# Patient Record
Sex: Female | Born: 1975 | Race: White | Hispanic: No | Marital: Married | Smoking: Never smoker
Health system: Southern US, Community
[De-identification: ages and names within clinical notes are randomized; demographics above are authoritative.]

## PROBLEM LIST (undated history)

## (undated) DIAGNOSIS — R011 Cardiac murmur, unspecified: Secondary | ICD-10-CM

## (undated) DIAGNOSIS — F988 Other specified behavioral and emotional disorders with onset usually occurring in childhood and adolescence: Secondary | ICD-10-CM

## (undated) HISTORY — PX: TYMPANOSTOMY TUBE PLACEMENT: SHX32

## (undated) HISTORY — PX: OTHER SURGICAL HISTORY: SHX169

## (undated) HISTORY — DX: Cardiac murmur, unspecified: R01.1

## (undated) HISTORY — DX: Other specified behavioral and emotional disorders with onset usually occurring in childhood and adolescence: F98.8

---

## 1998-01-13 ENCOUNTER — Other Ambulatory Visit: Admission: RE | Admit: 1998-01-13 | Discharge: 1998-01-13 | Payer: Self-pay | Admitting: Obstetrics and Gynecology

## 1999-02-02 ENCOUNTER — Other Ambulatory Visit: Admission: RE | Admit: 1999-02-02 | Discharge: 1999-02-02 | Payer: Self-pay | Admitting: Obstetrics and Gynecology

## 2000-02-03 ENCOUNTER — Other Ambulatory Visit: Admission: RE | Admit: 2000-02-03 | Discharge: 2000-02-03 | Payer: Self-pay | Admitting: Obstetrics and Gynecology

## 2000-09-09 ENCOUNTER — Inpatient Hospital Stay (HOSPITAL_COMMUNITY): Admission: AD | Admit: 2000-09-09 | Discharge: 2000-09-12 | Payer: Self-pay | Admitting: Obstetrics and Gynecology

## 2000-09-16 ENCOUNTER — Encounter: Admission: RE | Admit: 2000-09-16 | Discharge: 2000-10-05 | Payer: Self-pay | Admitting: Obstetrics and Gynecology

## 2001-02-06 ENCOUNTER — Other Ambulatory Visit: Admission: RE | Admit: 2001-02-06 | Discharge: 2001-02-06 | Payer: Self-pay | Admitting: Obstetrics and Gynecology

## 2002-02-12 ENCOUNTER — Other Ambulatory Visit: Admission: RE | Admit: 2002-02-12 | Discharge: 2002-02-12 | Payer: Self-pay | Admitting: Obstetrics and Gynecology

## 2003-02-12 ENCOUNTER — Other Ambulatory Visit: Admission: RE | Admit: 2003-02-12 | Discharge: 2003-02-12 | Payer: Self-pay | Admitting: Obstetrics and Gynecology

## 2003-02-13 ENCOUNTER — Other Ambulatory Visit: Admission: RE | Admit: 2003-02-13 | Discharge: 2003-02-13 | Payer: Self-pay | Admitting: Obstetrics and Gynecology

## 2003-08-28 ENCOUNTER — Inpatient Hospital Stay (HOSPITAL_COMMUNITY): Admission: AD | Admit: 2003-08-28 | Discharge: 2003-08-28 | Payer: Self-pay | Admitting: Obstetrics and Gynecology

## 2003-09-19 ENCOUNTER — Inpatient Hospital Stay (HOSPITAL_COMMUNITY): Admission: RE | Admit: 2003-09-19 | Discharge: 2003-09-21 | Payer: Self-pay | Admitting: Obstetrics and Gynecology

## 2003-09-19 ENCOUNTER — Encounter (INDEPENDENT_AMBULATORY_CARE_PROVIDER_SITE_OTHER): Payer: Self-pay | Admitting: *Deleted

## 2004-03-05 ENCOUNTER — Other Ambulatory Visit: Admission: RE | Admit: 2004-03-05 | Discharge: 2004-03-05 | Payer: Self-pay | Admitting: Obstetrics and Gynecology

## 2005-03-23 ENCOUNTER — Other Ambulatory Visit: Admission: RE | Admit: 2005-03-23 | Discharge: 2005-03-23 | Payer: Self-pay | Admitting: Obstetrics and Gynecology

## 2008-05-07 ENCOUNTER — Encounter: Admission: RE | Admit: 2008-05-07 | Discharge: 2008-05-07 | Payer: Self-pay | Admitting: Obstetrics and Gynecology

## 2010-07-24 NOTE — Op Note (Signed)
NAME:  Cassandra Estrada, Cassandra Estrada                      ACCOUNT NO.:  [id number]   MEDICAL RECORD NO.:  [id number]                   PATIENT TYPE:  INP   LOCATION:  9130                                 FACILITY:  [facility]   PHYSICIAN:  Malva Limes, M.D.                 DATE OF BIRTH:  02-25-1976   DATE OF PROCEDURE:  09/19/2003  DATE OF DISCHARGE:                                 OPERATIVE REPORT   PREOPERATIVE DIAGNOSES:  1. Intrauterine pregnancy at 4 weeks estimated gestational age.  2. History of prior cesarean section.  3. Desires repeat cesarean section and bilateral tubal ligation.   POSTOPERATIVE DIAGNOSIS:  1. Intrauterine pregnancy at 30 weeks estimated gestational age.  2. History of prior cesarean section.  3. Desires repeat cesarean section and bilateral tubal ligation.   PROCEDURE:  Repeat cesarean section with bilateral tubal ligation, Pomeroy  procedure.   SURGEON:  Malva Limes, M.D.   ASSISTANT:  Luvenia Redden, M.D.   ANESTHESIA:  Spinal.   ANTIBIOTICS:  Ancef 1 g.   ESTIMATED BLOOD LOSS:  900 mL.   COMPLICATIONS:  None.   SPECIMENS:  Portion of left and right fallopian tubes sent to pathology.   DRAINS:  Foley to bedside drainage.   FINDINGS:  The patient delivered one live viable white female infant  weighing 6 pounds 11 ounces.  Fallopian tubes and ovaries were within normal  limits.  Uterus was within normal limits.   DESCRIPTION OF PROCEDURE:  The patient was taken to the operating room where  a spinal anesthetic was administered without complications.  She was then  placed in the dorsal supine position with a left lateral tilt.  The patient  was prepped with Hibiclens.  A Foley catheter was placed and she was draped  in the usual fashion for this procedure.  A Pfannenstiel incision was made  through the previous scar.  This was carried down to the facia.  The fascia  was entered in the midline and extended laterally with Mayo scissors.  Rectus muscles  were then dissected from the fascia with the Bovie.  Rectus  muscles were divided midline and taken superiorly and inferiorly.  The  parietal peritoneum was entered sharply and taken superiorly and inferiorly.  The bladder flap was then taken down sharply.  A low transverse uterine  incision was made in the midline and extended laterally with blunt  dissection.  The amniotic fluid was noted to be clear.  The infant was  delivered in the vertex presentation.  On delivery of the head, the  oropharynx and nostrils were bulb suctioned.  The remaining infant was then  delivered.  The cord was then doubly clamped, cut and the infant handed to  the awaiting NICU team.  Cord blood was then obtained.  The placenta was  then manually removed.  Uterus was exteriorized.  The uterine cavity was  cleaned with a wet lap.  The  uterine incision was closed in a single layer  of 0 chromic in a running locking fashion.  The bladder flap was closed  using 3-0 chromic in a running fashion.  At this point, the isthmic portion  of the right fallopian tube was grasped with a Babcock.  A 2 to 3 cm knuckle  of fallopian tube was doubly ligated with 0 gut.  The knuckle was then  excised.  Both ostia were visualized.  Hemostasis appeared to be adequate.  A similar procedure was performed on the opposite site.  Following this, the  uterus was placed back in the abdominal cavity.  Hemostasis was checked.  Tubal ligation sutures were also checked.  At this point, parietal  peritoneum and rectus muscles were reapproximated in the midline using 3-0  chromic in a running fashion.  The fascia was then closed using 0 Monocryl  suture in a running fashion.  Subcuticular tissue was made hemostatic with  the Bovie. Stainless steel clips were used to close the skin.  The patient  tolerated the procedure well.  She was taken to the recovery room in stable  condition.  Instrument and lap counts were correct x2.                                                Malva Limes, M.D.    MA/MEDQ  D:  09/19/2003  T:  09/19/2003  Job:  [id number]

## 2010-07-24 NOTE — Discharge Summary (Signed)
Cassandra Estrada, [name]                      ACCOUNT NO.:  [id number]   MEDICAL RECORD NO.:  [id number]                   PATIENT TYPE:  INP   LOCATION:  9130                                 FACILITY:  [facility]   PHYSICIAN:  Randye Lobo, M.D.                DATE OF BIRTH:  07/13/1975   DATE OF ADMISSION:  09/19/2003  DATE OF DISCHARGE:  09/21/2003                                 DISCHARGE SUMMARY   FINAL DIAGNOSES:  1. Intrauterine pregnancy at 81 weeks estimated gestational age.  2. History of prior cesarean section, desires repeat cesarean section.  3. Desires permanent sterilization.   SURGEON:  Dr. Malva Limes.   ASSISTANT:  Dr. Lodema Hong.   COMPLICATIONS:  None.   This 35 year old G2 P0-1-0-1 presents at [redacted] weeks gestation for repeat  cesarean section and tubal ligation.  The patient's antepartum course had  been uncomplicated.  She does have a history of HSV but not had any  complications during her pregnancy.  At this point she is admitted.  She  desires a repeat cesarean section and desires permanent sterilization.  She  was taken to the operating room by Dr. Malva Limes on September 19, 2003 where  a repeat low transverse cesarean section is performed with the delivery of a  6-pound 11-ounce female infant with Apgars of 5 and 9.  Delivery went  without complication.  The patient still expressed her desires for a tubal  ligation and this was performed using the Pomeroy method.  The delivery went  without complications.  The patient's postoperative course was benign  without significant fevers.  The patient was felt ready for discharge on  postoperative day #2,  was sent home on a regular diet, told to decrease  activities, told to continue prenatal vitamins, was given a prescription for  Percocet one to two q.4h. as needed for pain and told she could use over-the-  counter ibuprofen as needed.  She was to follow up in the office in 2 days  for her staple removal, and of  course was to call with any problems.   LABORATORY DATA ON DISCHARGE:  The patient had a hemoglobin of 12.0, white  blood cell count of 18.7.     Leilani Able, P.A.-C.                Randye Lobo, M.D.    MB/MEDQ  D:  10/17/2003  T:  10/18/2003  Job:  [id number]

## 2010-07-24 NOTE — Discharge Summary (Signed)
[facility]  Patient:    Cassandra Estrada, Cassandra Estrada                   MRN: [id number] Adm. Date:  [id number] Disc. Date: [id number] Attending:  Miguel Aschoff Dictator:   Leilani Able, P.A.                           Discharge Summary  FINAL DIAGNOSES:              Intrauterine pregnancy at [redacted] weeks gestation, nonreassuring fetal heart pattern.  PROCEDURE:                    Primary low transverse cesarean section.  SURGEON:                      Miguel Aschoff, M.D.  COMPLICATIONS:                None.  HISTORY:                      This 35 year old G1, P0 presents at [redacted] weeks gestation with spontaneous rupture of membranes with grossly clear fluid. Patient was admitted to the hospital.  She progressed slowly and did reach about 8 cm of dilation.  At this point she developed some deep variable decelerations with very slow recovery that did not respond to oxygen and position change.  There were some deep decelerations occurring even when patient was on her left side again.  In view of these decelerations patient was taken to the operating room for a cesarean section.  Patient was taken to the operating room on September 09, 2000 by Dr. Miguel Aschoff where a primary low transverse cesarean section was performed with the delivery of a 6 pound 10 ounce female infant with Apgars of 8 and 8.  Delivery went without complications.  Patients postoperative course was benign without significant fevers.  She was felt ready for discharge on postoperative day #2.  Sent home on a regular diet.  Told to decrease activities.  Told to continue prenatal vitamins.  Was given a prescription for Tylox and Motrin.  Told to follow-up in the office in four weeks.DD:  09/29/00 TD:  09/29/00 Job: [id number]

## 2010-07-24 NOTE — Op Note (Signed)
[facility]  Patient:    Cassandra Estrada                   MRN: [id number] Proc. Date: 09/09/00 Adm. Date:  [id number] Attending:  Miguel Aschoff                           Operative Report  PREOPERATIVE DIAGNOSES:       1. Intrauterine pregnancy at 37 weeks.                               2. Nonreassuring fetal heart pattern.  POSTOPERATIVE DIAGNOSES:      1. Intrauterine pregnancy at 37 weeks.                               2. Nonreassuring fetal heart pattern.                               3. Delivery of viable female infant, Apgars                                  8, 8.  OPERATION:                    Primary low transverse cesarean section.  SURGEON:                      Miguel Aschoff, M.D.  ANESTHESIA:                   Epidural.  COMPLICATIONS:                None.  ESTIMATED BLOOD LOSS:         Approximately 600 cc.  JUSTIFICATION:                The patient is a 35 year old white female, gravida 1, para 0, with an estimated date of confinement by ultrasound of September 27, 2000.  She presented to the office with spontaneous rupture of membranes with grossly clear fluid.  She was admitted to the [facility] and did progress slowly and did reach approximately 8 cm.  Then she developed deep variable decelerations with very slow recovery that did respond to oxygen and position treatment.  Then when placed on her left side again, deep decelerations occurred.  In view of these decelerations and the patient being a primigravida with delivery not imminent, it was felt that labor should not be allowed to continue, and she is being taken up for primary cesarean section.  DESCRIPTION OF PROCEDURE:     The patient was taken to the operating room, placed in a supine position.  While being monitored, the heart rate did respond, and it was possible to allow a satisfactory level of anesthesia to be achieved.  Once this was done, Pfannenstiel incision was  made, extended down through the subcutaneous tissue, with bleeding points being clamped and coagulated as they were encountered.  The fascia was then identified, incised transversely, and separated from the underlying rectus muscles.  Rectus muscles were divided in the midline.  Peritoneum was then identified and entered carefully, avoiding underlying structures.  The bladder flap was then  created and protected with the bladder blade.  Then elliptical transverse incision was made into the lower uterine segment.  Amniotic cavity was entered.  Clear fluid was obtained.  The patient was then delivered of a viable female infant, Apgar 8 at one minute and 8 at five minutes, from a vertex ROP position.  The nose and mouth were suctioned.  The baby was handed to the pediatric team in attendance.  No obvious nuchal cord was noted or cord about an extremity which would have explained the decelerations.  Cord blood were then obtained for appropriate studies, and then the placenta was delivered.  The uterus was then evacuated of any remaining products of conception.  The angles of the uterine incision were then ligated using figure-of-eight sutures of #1 Vicryl.  Then the uterus was closed in layers. The first layer was a running interlocking suture of #1 Vicryl, followed by an imbricating suture of #1 Vicryl.  The bladder flap was then reapproximated using running continuous 2-0 Vicryl suture.  Lap counts were taken and found to be correct.  There was excellent hemostasis, and at this point the abdomen was closed with the parietal peritoneum closed using running continuous 0 Vicryl suture.  The fascia was closed using two sutures of 0 Vicryl starting at the lateral fascial angles and meeting in the midline.  Subcutaneous tissue and skin were closed using staples.  The patient tolerated the procedure well and went to the recovery room in satisfactory condition. DD:  09/09/00 TD:  09/10/00 Job:  [id number]

## 2010-08-13 ENCOUNTER — Other Ambulatory Visit: Payer: Self-pay | Admitting: Obstetrics and Gynecology

## 2011-08-24 ENCOUNTER — Other Ambulatory Visit: Payer: Self-pay | Admitting: Obstetrics and Gynecology

## 2012-06-19 ENCOUNTER — Encounter: Payer: Self-pay | Admitting: Nurse Practitioner

## 2012-06-19 ENCOUNTER — Encounter: Payer: Self-pay | Admitting: *Deleted

## 2012-06-19 ENCOUNTER — Ambulatory Visit (INDEPENDENT_AMBULATORY_CARE_PROVIDER_SITE_OTHER): Payer: BC Managed Care – PPO | Admitting: Nurse Practitioner

## 2012-06-19 VITALS — BP 107/73 | HR 67 | Temp 97.8°F | Ht 64.0 in | Wt 134.0 lb

## 2012-06-19 DIAGNOSIS — F988 Other specified behavioral and emotional disorders with onset usually occurring in childhood and adolescence: Secondary | ICD-10-CM

## 2012-06-19 MED ORDER — LISDEXAMFETAMINE DIMESYLATE 40 MG PO CAPS: 40.0000 mg | ORAL_CAPSULE | ORAL | Status: DC

## 2012-06-19 NOTE — Progress Notes (Signed)
  Subjective:    Patient ID: Cassandra Estrada, female    DOB: 09/11/1969, 37 y.o.   MRN: [id number]  HPI Pt states things are good but would like to increase dosage for the remainder of the semester. 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 Feels it would help with balancing her tasks and accomplishing all of her necessary tasks. States she is aware that she may feel the side effects of the medications a bit more with an increased dose. Pt's only complaint is occasional burning stomach pain that occurs more in the afternoons/evening. Pt feels she can control it with diet.    Review of Systems  Gastrointestinal: Positive for abdominal pain (described as "burning").  All other systems reviewed and are negative.       Objective:   Physical Exam  Vitals reviewed. Cardiovascular: Normal rate, regular rhythm and normal heart sounds.   Pulmonary/Chest: Effort normal and breath sounds normal.  Psychiatric: She has a normal mood and affect. Her behavior is normal. Judgment and thought content normal.   BP 107/73  Pulse 67  Temp(Src) 97.8 F (36.6 C) (Oral)  Ht 5\' 4"  (1.626 m)  Wt 134 lb (60.782 kg)  BMI 22.99 kg/m2  LMP 06/08/2012        Assessment & Plan:  1. ADD (attention deficit disorder) Stress management - lisdexamfetamine (VYVANSE) 40 MG capsule; Take 1 capsule (40 mg total) by mouth every morning.  Dispense: 30 capsule; Refill: 0- Post dated RX as well to be fill 07/19/12. Cassandra Daphine Deutscher, FNP

## 2012-06-19 NOTE — Patient Instructions (Signed)
Attention Deficit Hyperactivity Disorder Attention deficit hyperactivity disorder (ADHD) is a problem with behavior issues based on the way the brain functions (neurobehavioral disorder). It is a common reason for behavior and academic problems in school. CAUSES  The cause of ADHD is unknown in most cases. It may run in families. It sometimes can be associated with learning disabilities and other behavioral problems. SYMPTOMS  There are 3 types of ADHD. The 3 types and some of the symptoms include:  Inattentive  Gets bored or distracted easily.  Loses or forgets things. Forgets to hand in homework.  Has trouble organizing or completing tasks.  Difficulty staying on task.  An inability to organize daily tasks and school work.  Leaving projects, chores, or homework unfinished.  Trouble paying attention or responding to details. Careless mistakes.  Difficulty following directions. Often seems like is not listening.  Dislikes activities that require sustained attention (like chores or homework).  Hyperactive-impulsive  Feels like it is impossible to sit still or stay in a seat. Fidgeting with hands and feet.  Trouble waiting turn.  Talking too much or out of turn. Interruptive.  Speaks or acts impulsively.  Aggressive, disruptive behavior.  Constantly busy or on the go, noisy.  Combined  Has symptoms of both of the above. Often children with ADHD feel discouraged about themselves and with school. They often perform well below their abilities in school. These symptoms can cause problems in home, school, and in relationships with peers. As children get older, the excess motor activities can calm down, but the problems with paying attention and staying organized persist. Most children do not outgrow ADHD but with good treatment can learn to cope with the symptoms. DIAGNOSIS  When ADHD is suspected, the diagnosis should be made by professionals trained in ADHD.  Diagnosis will  include:  Ruling out other reasons for the child's behavior.  The caregivers will check with the child's school and check their medical records.  They will talk to teachers and parents.  Behavior rating scales for the child will be filled out by those dealing with the child on a daily basis. A diagnosis is made only after all information has been considered. TREATMENT  Treatment usually includes behavioral treatment often along with medicines. It may include stimulant medicines. The stimulant medicines decrease impulsivity and hyperactivity and increase attention. Other medicines used include antidepressants and certain blood pressure medicines. Most experts agree that treatment for ADHD should address all aspects of the child's functioning. Treatment should not be limited to the use of medicines alone. Treatment should include structured classroom management. The parents must receive education to address rewarding good behavior, discipline, and limit-setting. Tutoring or behavioral therapy or both should be available for the child. If untreated, the disorder can have long-term serious effects into adolescence and adulthood. HOME CARE INSTRUCTIONS   Often with ADHD there is a lot of frustration among the family in dealing with the illness. There is often blame and anger that is not warranted. This is a life long illness. There is no way to prevent ADHD. In many cases, because the problem affects the family as a whole, the entire family may need help. A therapist can help the family find better ways to handle the disruptive behaviors and promote change. If the child is young, most of the therapist's work is with the parents. Parents will learn techniques for coping with and improving their child's behavior. Sometimes only the child with the ADHD needs counseling. Your caregivers can help   you make these decisions.  Children with ADHD may need help in organizing. Some helpful tips include:  Keep  routines the same every day from wake-up time to bedtime. Schedule everything. This includes homework and playtime. This should include outdoor and indoor recreation. Keep the schedule on the refrigerator or a bulletin board where it is frequently seen. Mark schedule changes as far in advance as possible.  Have a place for everything and keep everything in its place. This includes clothing, backpacks, and school supplies.  Encourage writing down assignments and bringing home needed books.  Offer your child a well-balanced diet. Breakfast is especially important for school performance. Children should avoid drinks with caffeine including:  Soft drinks.  Coffee.  Tea.  However, some older children (adolescents) may find these drinks helpful in improving their attention.  Children with ADHD need consistent rules that they can understand and follow. If rules are followed, give small rewards. Children with ADHD often receive, and expect, criticism. Look for good behavior and praise it. Set realistic goals. Give clear instructions. Look for activities that can foster success and self-esteem. Make time for pleasant activities with your child. Give lots of affection.  Parents are their children's greatest advocates. Learn as much as possible about ADHD. This helps you become a stronger and better advocate for your child. It also helps you educate your child's teachers and instructors if they feel inadequate in these areas. Parent support groups are often helpful. A national group with local chapters is called CHADD (Children and Adults with Attention Deficit Hyperactivity Disorder). PROGNOSIS  There is no cure for ADHD. Children with the disorder seldom outgrow it. Many find adaptive ways to accommodate the ADHD as they mature. SEEK MEDICAL CARE IF:  Your child has repeated muscle twitches, cough or speech outbursts.  Your child has sleep problems.  Your child has a marked loss of  appetite.  Your child develops depression.  Your child has new or worsening behavioral problems.  Your child develops dizziness.  Your child has a racing heart.  Your child has stomach pains.  Your child develops headaches. Document Released: 02/12/2002 Document Revised: 05/17/2011 Document Reviewed: 09/25/2007 [employer] Patient Information 2013 [employer].  

## 2012-06-21 ENCOUNTER — Encounter: Payer: Self-pay | Admitting: *Deleted

## 2012-07-28 ENCOUNTER — Telehealth: Payer: Self-pay | Admitting: Nurse Practitioner

## 2012-07-28 MED ORDER — SERTRALINE HCL 50 MG PO TABS: 50.0000 mg | ORAL_TABLET | Freq: Every day | ORAL | Status: DC

## 2012-07-28 NOTE — Telephone Encounter (Signed)
zoloft rx sent in

## 2012-08-29 ENCOUNTER — Other Ambulatory Visit: Payer: Self-pay | Admitting: Obstetrics and Gynecology

## 2012-09-15 ENCOUNTER — Encounter: Payer: Self-pay | Admitting: Nurse Practitioner

## 2012-09-15 ENCOUNTER — Ambulatory Visit (INDEPENDENT_AMBULATORY_CARE_PROVIDER_SITE_OTHER): Payer: BC Managed Care – PPO | Admitting: Nurse Practitioner

## 2012-09-15 VITALS — BP 113/78 | HR 66 | Temp 98.2°F | Ht 64.0 in | Wt 130.0 lb

## 2012-09-15 DIAGNOSIS — F988 Other specified behavioral and emotional disorders with onset usually occurring in childhood and adolescence: Secondary | ICD-10-CM

## 2012-09-15 MED ORDER — LISDEXAMFETAMINE DIMESYLATE 40 MG PO CAPS: 40.0000 mg | ORAL_CAPSULE | ORAL | Status: DC

## 2012-09-15 NOTE — Patient Instructions (Signed)
Attention Deficit Hyperactivity Disorder Attention deficit hyperactivity disorder (ADHD) is a problem with behavior issues based on the way the brain functions (neurobehavioral disorder). It is a common reason for behavior and academic problems in school. CAUSES  The cause of ADHD is unknown in most cases. It may run in families. It sometimes can be associated with learning disabilities and other behavioral problems. SYMPTOMS  There are 3 types of ADHD. The 3 types and some of the symptoms include:  Inattentive  Gets bored or distracted easily.  Loses or forgets things. Forgets to hand in homework.  Has trouble organizing or completing tasks.  Difficulty staying on task.  An inability to organize daily tasks and school work.  Leaving projects, chores, or homework unfinished.  Trouble paying attention or responding to details. Careless mistakes.  Difficulty following directions. Often seems like is not listening.  Dislikes activities that require sustained attention (like chores or homework).  Hyperactive-impulsive  Feels like it is impossible to sit still or stay in a seat. Fidgeting with hands and feet.  Trouble waiting turn.  Talking too much or out of turn. Interruptive.  Speaks or acts impulsively.  Aggressive, disruptive behavior.  Constantly busy or on the go, noisy.  Combined  Has symptoms of both of the above. Often children with ADHD feel discouraged about themselves and with school. They often perform well below their abilities in school. These symptoms can cause problems in home, school, and in relationships with peers. As children get older, the excess motor activities can calm down, but the problems with paying attention and staying organized persist. Most children do not outgrow ADHD but with good treatment can learn to cope with the symptoms. DIAGNOSIS  When ADHD is suspected, the diagnosis should be made by professionals trained in ADHD.  Diagnosis will  include:  Ruling out other reasons for the child's behavior.  The caregivers will check with the child's school and check their medical records.  They will talk to teachers and parents.  Behavior rating scales for the child will be filled out by those dealing with the child on a daily basis. A diagnosis is made only after all information has been considered. TREATMENT  Treatment usually includes behavioral treatment often along with medicines. It may include stimulant medicines. The stimulant medicines decrease impulsivity and hyperactivity and increase attention. Other medicines used include antidepressants and certain blood pressure medicines. Most experts agree that treatment for ADHD should address all aspects of the child's functioning. Treatment should not be limited to the use of medicines alone. Treatment should include structured classroom management. The parents must receive education to address rewarding good behavior, discipline, and limit-setting. Tutoring or behavioral therapy or both should be available for the child. If untreated, the disorder can have long-term serious effects into adolescence and adulthood. HOME CARE INSTRUCTIONS   Often with ADHD there is a lot of frustration among the family in dealing with the illness. There is often blame and anger that is not warranted. This is a life long illness. There is no way to prevent ADHD. In many cases, because the problem affects the family as a whole, the entire family may need help. A therapist can help the family find better ways to handle the disruptive behaviors and promote change. If the child is young, most of the therapist's work is with the parents. Parents will learn techniques for coping with and improving their child's behavior. Sometimes only the child with the ADHD needs counseling. Your caregivers can help   you make these decisions.  Children with ADHD may need help in organizing. Some helpful tips include:  Keep  routines the same every day from wake-up time to bedtime. Schedule everything. This includes homework and playtime. This should include outdoor and indoor recreation. Keep the schedule on the refrigerator or a bulletin board where it is frequently seen. Mark schedule changes as far in advance as possible.  Have a place for everything and keep everything in its place. This includes clothing, backpacks, and school supplies.  Encourage writing down assignments and bringing home needed books.  Offer your child a well-balanced diet. Breakfast is especially important for school performance. Children should avoid drinks with caffeine including:  Soft drinks.  Coffee.  Tea.  However, some older children (adolescents) may find these drinks helpful in improving their attention.  Children with ADHD need consistent rules that they can understand and follow. If rules are followed, give small rewards. Children with ADHD often receive, and expect, criticism. Look for good behavior and praise it. Set realistic goals. Give clear instructions. Look for activities that can foster success and self-esteem. Make time for pleasant activities with your child. Give lots of affection.  Parents are their children's greatest advocates. Learn as much as possible about ADHD. This helps you become a stronger and better advocate for your child. It also helps you educate your child's teachers and instructors if they feel inadequate in these areas. Parent support groups are often helpful. A national group with local chapters is called CHADD (Children and Adults with Attention Deficit Hyperactivity Disorder). PROGNOSIS  There is no cure for ADHD. Children with the disorder seldom outgrow it. Many find adaptive ways to accommodate the ADHD as they mature. SEEK MEDICAL CARE IF:  Your child has repeated muscle twitches, cough or speech outbursts.  Your child has sleep problems.  Your child has a marked loss of  appetite.  Your child develops depression.  Your child has new or worsening behavioral problems.  Your child develops dizziness.  Your child has a racing heart.  Your child has stomach pains.  Your child develops headaches. Document Released: 02/12/2002 Document Revised: 05/17/2011 Document Reviewed: 09/25/2007 [employer] Patient Information 2014 [employer].  

## 2012-09-15 NOTE — Progress Notes (Signed)
  Subjective:    Patient ID: Cassandra Estrada, female    DOB: Oct 25, 1975, 37 y.o.   MRN: [id number]  HPI Pt here for follow-up for ADD. Currently taking Vyvanse 40 mg. Pt feels like it working well. The dose was increased last visit. Feels like she is able to concentrate better, keeps her calm.    Review of Systems  All other systems reviewed and are negative.       Objective:   Physical Exam  Constitutional: She is oriented to person, place, and time. She appears well-developed and well-nourished.  Cardiovascular: Normal rate, regular rhythm, normal heart sounds and intact distal pulses.   Pulmonary/Chest: Effort normal and breath sounds normal.  Musculoskeletal: Normal range of motion.  Neurological: She is alert and oriented to person, place, and time.  Psychiatric: She has a normal mood and affect. Her behavior is normal. Judgment and thought content normal.     BP 113/78  Pulse 66  Temp(Src) 98.2 F (36.8 C) (Oral)  Ht 5\' 4"  (1.626 m)  Wt 130 lb (58.968 kg)  BMI 22.3 kg/m2       Assessment & Plan:   1. ADD (attention deficit disorder) Stress management - lisdexamfetamine (VYVANSE) 40 MG capsule; Take 1 capsule (40 mg total) by mouth every morning.  Dispense: 30 capsule; Refill: 0 - lisdexamfetamine (VYVANSE) 40 MG capsule; Take 1 capsule (40 mg total) by mouth every morning.  Dispense: 30 capsule; Refill: 0   Mary-Margaret Daphine Deutscher, FNP

## 2012-11-17 ENCOUNTER — Telehealth: Payer: Self-pay | Admitting: Nurse Practitioner

## 2012-11-17 DIAGNOSIS — F988 Other specified behavioral and emotional disorders with onset usually occurring in childhood and adolescence: Secondary | ICD-10-CM

## 2012-11-20 MED ORDER — LISDEXAMFETAMINE DIMESYLATE 40 MG PO CAPS: 40.0000 mg | ORAL_CAPSULE | ORAL | Status: DC

## 2012-11-20 NOTE — Telephone Encounter (Signed)
rx ready for pickup 

## 2012-11-20 NOTE — Telephone Encounter (Signed)
Please advise 

## 2012-11-21 NOTE — Telephone Encounter (Signed)
Patient aware ready to pick RX

## 2012-12-27 ENCOUNTER — Telehealth: Payer: Self-pay | Admitting: Nurse Practitioner

## 2012-12-27 DIAGNOSIS — F988 Other specified behavioral and emotional disorders with onset usually occurring in childhood and adolescence: Secondary | ICD-10-CM

## 2012-12-28 MED ORDER — LISDEXAMFETAMINE DIMESYLATE 40 MG PO CAPS: 40.0000 mg | ORAL_CAPSULE | ORAL | Status: DC

## 2012-12-28 NOTE — Telephone Encounter (Signed)
rx ready for pickup 

## 2012-12-28 NOTE — Telephone Encounter (Signed)
Up front patient aware 

## 2013-02-07 ENCOUNTER — Telehealth: Payer: Self-pay | Admitting: Nurse Practitioner

## 2013-02-07 DIAGNOSIS — F988 Other specified behavioral and emotional disorders with onset usually occurring in childhood and adolescence: Secondary | ICD-10-CM

## 2013-02-07 MED ORDER — LISDEXAMFETAMINE DIMESYLATE 40 MG PO CAPS
40.0000 mg | ORAL_CAPSULE | ORAL | Status: DC
Start: 2013-02-07 — End: 2013-04-26

## 2013-02-07 NOTE — Telephone Encounter (Signed)
PT NOTIFIED  

## 2013-02-07 NOTE — Telephone Encounter (Signed)
rx ready for pickup 

## 2013-03-15 ENCOUNTER — Telehealth: Payer: Self-pay | Admitting: Nurse Practitioner

## 2013-03-15 DIAGNOSIS — F988 Other specified behavioral and emotional disorders with onset usually occurring in childhood and adolescence: Secondary | ICD-10-CM

## 2013-03-15 MED ORDER — LISDEXAMFETAMINE DIMESYLATE 40 MG PO CAPS: 40.0000 mg | ORAL_CAPSULE | ORAL | Status: DC

## 2013-03-15 NOTE — Telephone Encounter (Signed)
rx ready for pickup 

## 2013-03-15 NOTE — Telephone Encounter (Signed)
Patient aware to pick up 

## 2013-04-26 ENCOUNTER — Telehealth: Payer: Self-pay | Admitting: Nurse Practitioner

## 2013-04-26 DIAGNOSIS — F988 Other specified behavioral and emotional disorders with onset usually occurring in childhood and adolescence: Secondary | ICD-10-CM

## 2013-04-26 MED ORDER — LISDEXAMFETAMINE DIMESYLATE 40 MG PO CAPS: 40.0000 mg | ORAL_CAPSULE | ORAL | Status: DC

## 2013-04-26 NOTE — Telephone Encounter (Signed)
rx ready for pickup 

## 2013-05-01 ENCOUNTER — Telehealth: Payer: Self-pay | Admitting: Nurse Practitioner

## 2013-05-01 DIAGNOSIS — F988 Other specified behavioral and emotional disorders with onset usually occurring in childhood and adolescence: Secondary | ICD-10-CM

## 2013-05-01 MED ORDER — LISDEXAMFETAMINE DIMESYLATE 40 MG PO CAPS: 40.0000 mg | ORAL_CAPSULE | ORAL | Status: DC

## 2013-05-01 NOTE — Telephone Encounter (Signed)
Patient aware.

## 2013-05-01 NOTE — Telephone Encounter (Signed)
It says printed went to look up front not there and went through the pick up box and nobody has picked one up please re print

## 2013-05-01 NOTE — Telephone Encounter (Signed)
rx ready for pickup 

## 2013-05-01 NOTE — Telephone Encounter (Signed)
rx was written [phone]

## 2013-05-30 ENCOUNTER — Telehealth: Payer: Self-pay | Admitting: Nurse Practitioner

## 2013-05-30 DIAGNOSIS — F988 Other specified behavioral and emotional disorders with onset usually occurring in childhood and adolescence: Secondary | ICD-10-CM

## 2013-05-30 NOTE — Telephone Encounter (Signed)
Left message on patient's personal voicemail that she needs to schedule an appointment. If she is going to run out within the next few days then we should be able to provide a refill to last until she is seen.

## 2013-05-30 NOTE — Telephone Encounter (Signed)
NTBS fo refill

## 2013-05-31 MED ORDER — LISDEXAMFETAMINE DIMESYLATE 40 MG PO CAPS: 40.0000 mg | ORAL_CAPSULE | ORAL | Status: DC

## 2013-05-31 NOTE — Telephone Encounter (Signed)
rx ready for pick up no more refills without being seen  

## 2013-05-31 NOTE — Telephone Encounter (Signed)
Rx for vyvance ready.

## 2013-06-01 ENCOUNTER — Telehealth: Payer: Self-pay | Admitting: Nurse Practitioner

## 2013-06-04 NOTE — Telephone Encounter (Signed)
Derry SkillDonna L is working on this today

## 2013-06-29 ENCOUNTER — Encounter: Payer: Self-pay | Admitting: Nurse Practitioner

## 2013-06-29 ENCOUNTER — Ambulatory Visit (INDEPENDENT_AMBULATORY_CARE_PROVIDER_SITE_OTHER): Payer: BC Managed Care – PPO | Admitting: Nurse Practitioner

## 2013-06-29 VITALS — BP 107/68 | HR 79 | Temp 97.0°F | Ht 64.0 in | Wt 133.0 lb

## 2013-06-29 DIAGNOSIS — F988 Other specified behavioral and emotional disorders with onset usually occurring in childhood and adolescence: Secondary | ICD-10-CM

## 2013-06-29 MED ORDER — LISDEXAMFETAMINE DIMESYLATE 40 MG PO CAPS: 40.0000 mg | ORAL_CAPSULE | ORAL | Status: DC

## 2013-06-29 MED ORDER — LISDEXAMFETAMINE DIMESYLATE 50 MG PO CAPS: 50.0000 mg | ORAL_CAPSULE | ORAL | Status: DC

## 2013-06-29 NOTE — Progress Notes (Signed)
   Subjective:    Patient ID: Cassandra Estrada, female    DOB: 01/23/1976, 38 y.o.   MRN: [id number]  HPI  Patient here today for adult ADD recheck- SHe is currently on vyvase 40mg  daily- Doing well without side effects- SHe is concentrating good at work.    Review of Systems  Constitutional: Negative.   HENT: Negative.   Cardiovascular: Negative.   Psychiatric/Behavioral: Negative.   All other systems reviewed and are negative.      Objective:   Physical Exam  Constitutional: She is oriented to person, place, and time. She appears well-developed and well-nourished.  Cardiovascular: Normal rate, regular rhythm and normal heart sounds.   Pulmonary/Chest: Effort normal and breath sounds normal.  Neurological: She is alert and oriented to person, place, and time.  Skin: Skin is warm.  Psychiatric: She has a normal mood and affect. Her behavior is normal. Judgment and thought content normal.          Assessment & Plan:   1. ADD (attention deficit disorder)    Meds ordered this encounter  Medications  . lisdexamfetamine (VYVANSE) 40 MG capsule    Sig: Take 1 capsule (40 mg total) by mouth every morning.    Dispense:  30 capsule    Refill:  0    DO NOT FILL TILL 07/28/13    Order Specific Question:  Supervising Provider    Answer:  Ernestina PennaMOORE, DONALD W [[id number]]  . lisdexamfetamine (VYVANSE) 50 MG capsule    Sig: Take 1 capsule (50 mg total) by mouth every morning.    Dispense:  30 capsule    Refill:  0    Order Specific Question:  Supervising Provider    Answer:  Ernestina PennaMOORE, DONALD W [[id number]]   Going to increase vyvanse dose to 50mg  just until school year is out and will go back to 40mg  after that Follow up in 3 months  Mary-Margaret Daphine DeutscherMartin, FNP

## 2013-09-04 ENCOUNTER — Other Ambulatory Visit: Payer: Self-pay | Admitting: Obstetrics and Gynecology

## 2013-09-04 DIAGNOSIS — N644 Mastodynia: Secondary | ICD-10-CM

## 2013-09-05 LAB — CYTOLOGY - PAP

## 2013-09-14 ENCOUNTER — Telehealth: Payer: Self-pay | Admitting: Nurse Practitioner

## 2013-09-14 DIAGNOSIS — F988 Other specified behavioral and emotional disorders with onset usually occurring in childhood and adolescence: Secondary | ICD-10-CM

## 2013-09-14 MED ORDER — LISDEXAMFETAMINE DIMESYLATE 40 MG PO CAPS: 40.0000 mg | ORAL_CAPSULE | ORAL | Status: DC

## 2013-09-14 NOTE — Telephone Encounter (Signed)
Patient aware.

## 2013-09-14 NOTE — Telephone Encounter (Signed)
rx ready for pickup 

## 2013-09-17 ENCOUNTER — Ambulatory Visit
Admission: RE | Admit: 2013-09-17 | Discharge: 2013-09-17 | Disposition: A | Discharge status: Home / Self Care | Payer: BC Managed Care – PPO | Source: Ambulatory Visit | Attending: Obstetrics and Gynecology | Admitting: Obstetrics and Gynecology

## 2013-09-17 DIAGNOSIS — N644 Mastodynia: Secondary | ICD-10-CM

## 2013-10-29 ENCOUNTER — Telehealth: Payer: Self-pay | Admitting: Nurse Practitioner

## 2013-10-29 DIAGNOSIS — F988 Other specified behavioral and emotional disorders with onset usually occurring in childhood and adolescence: Secondary | ICD-10-CM

## 2013-10-29 MED ORDER — LISDEXAMFETAMINE DIMESYLATE 40 MG PO CAPS: 40.0000 mg | ORAL_CAPSULE | ORAL | Status: DC

## 2013-10-29 NOTE — Telephone Encounter (Signed)
Pt notified to pickup RX. 

## 2013-10-29 NOTE — Telephone Encounter (Signed)
rx ready for pickup 

## 2013-12-04 ENCOUNTER — Telehealth: Payer: Self-pay | Admitting: Nurse Practitioner

## 2013-12-05 MED ORDER — LISDEXAMFETAMINE DIMESYLATE 50 MG PO CAPS: 50.0000 mg | ORAL_CAPSULE | ORAL | Status: DC

## 2013-12-05 NOTE — Telephone Encounter (Signed)
Left message rx is ready for patient to pick up

## 2014-01-18 ENCOUNTER — Telehealth: Payer: Self-pay | Admitting: Nurse Practitioner

## 2014-01-18 DIAGNOSIS — F988 Other specified behavioral and emotional disorders with onset usually occurring in childhood and adolescence: Secondary | ICD-10-CM

## 2014-01-21 ENCOUNTER — Other Ambulatory Visit: Payer: Self-pay | Admitting: Nurse Practitioner

## 2014-01-21 ENCOUNTER — Other Ambulatory Visit: Payer: Self-pay | Admitting: *Deleted

## 2014-01-21 DIAGNOSIS — F988 Other specified behavioral and emotional disorders with onset usually occurring in childhood and adolescence: Secondary | ICD-10-CM

## 2014-01-21 MED ORDER — LISDEXAMFETAMINE DIMESYLATE 40 MG PO CAPS: 40.0000 mg | ORAL_CAPSULE | ORAL | Status: DC

## 2014-01-21 NOTE — Telephone Encounter (Signed)
Script for vyvance ready.

## 2014-01-21 NOTE — Telephone Encounter (Signed)
vyvanse rx ready for pick up  

## 2014-03-11 ENCOUNTER — Telehealth: Payer: Self-pay | Admitting: *Deleted

## 2014-03-11 ENCOUNTER — Telehealth: Payer: Self-pay | Admitting: Nurse Practitioner

## 2014-03-11 DIAGNOSIS — F988 Other specified behavioral and emotional disorders with onset usually occurring in childhood and adolescence: Secondary | ICD-10-CM

## 2014-03-11 MED ORDER — LISDEXAMFETAMINE DIMESYLATE 40 MG PO CAPS: 40.0000 mg | ORAL_CAPSULE | ORAL | Status: DC

## 2014-03-11 NOTE — Telephone Encounter (Signed)
Script for vyvance ready. 

## 2014-03-11 NOTE — Telephone Encounter (Signed)
vyvanse rx ready for pick up  

## 2014-09-24 ENCOUNTER — Other Ambulatory Visit: Payer: Self-pay | Admitting: Obstetrics and Gynecology

## 2014-09-25 LAB — CYTOLOGY - PAP

## 2015-05-21 ENCOUNTER — Encounter: Payer: Self-pay | Admitting: Family

## 2015-05-21 ENCOUNTER — Ambulatory Visit (INDEPENDENT_AMBULATORY_CARE_PROVIDER_SITE_OTHER): Payer: BC Managed Care – PPO | Admitting: Family

## 2015-05-21 VITALS — BP 116/82 | HR 76 | Temp 97.3°F | Ht 64.0 in | Wt 133.0 lb

## 2015-05-21 DIAGNOSIS — R000 Tachycardia, unspecified: Secondary | ICD-10-CM

## 2015-05-21 DIAGNOSIS — F411 Generalized anxiety disorder: Secondary | ICD-10-CM | POA: Diagnosis not present

## 2015-05-21 MED ORDER — ESCITALOPRAM OXALATE 10 MG PO TABS: 10.0000 mg | ORAL_TABLET | Freq: Every day | ORAL | Status: DC

## 2015-05-21 NOTE — Progress Notes (Signed)
Subjective:    Patient ID: Cassandra Estrada, female    DOB: [birth year], 40 y.o.   MRN: [id number]  Presents today complaining of intermitant palpitations at rest and associated with stress.  Relieved with rest.  Longstanding heart murmur for 15 years. Describes palpitations as regular and normal rate but pounding.  Has never had an echocardiogram.  EKG obtained today. Palpitations  This is a recurrent problem. The current episode started more than 1 year ago. The problem occurs intermittently. The problem has been gradually worsening. On average, each episode lasts 1 hour. The symptoms are aggravated by stress. Associated symptoms include anxiety, chest fullness and coughing. Pertinent negatives include no chest pain, diaphoresis, dizziness, fever, irregular heartbeat, nausea, near-syncope, numbness, shortness of breath, syncope or weakness. She has tried deep relaxation for the symptoms. The treatment provided moderate relief. Her past medical history is significant for anxiety.  Anxiety Presents for initial visit. Onset was 6 to 12 months ago. The problem has been gradually worsening. Symptoms include decreased concentration, excessive worry, irritability, nervous/anxious behavior, palpitations, panic and restlessness. Patient reports no chest pain, depressed mood, dizziness, hyperventilation, insomnia, nausea or shortness of breath. Symptoms occur constantly. The severity of symptoms is moderate. The quality of sleep is fair.   Her past medical history is significant for anxiety/panic attacks.      Review of Systems  Constitutional: Positive for irritability and fatigue. Negative for fever and diaphoresis.  HENT: Positive for postnasal drip, rhinorrhea, sinus pressure, sneezing and sore throat. Negative for ear pain.   Eyes: Negative.   Respiratory: Positive for cough. Negative for shortness of breath.   Cardiovascular: Positive for palpitations. Negative for chest pain, syncope and  near-syncope.  Gastrointestinal: Negative.  Negative for nausea.  Endocrine: Negative.  Negative for cold intolerance and heat intolerance.  Genitourinary: Negative.   Musculoskeletal: Positive for neck pain.       Left clavicle pain   Skin: Negative.   Allergic/Immunologic: Positive for environmental allergies.  Neurological: Negative.  Negative for dizziness, weakness, numbness and headaches.  Hematological: Negative.   Psychiatric/Behavioral: Positive for decreased concentration. The patient is nervous/anxious. The patient does not have insomnia.   All other systems reviewed and are negative.      Objective:   Physical Exam  Constitutional: She is oriented to person, place, and time. She appears well-developed and well-nourished. No distress.  HENT:  Head: Normocephalic and atraumatic.  Eyes: Pupils are equal, round, and reactive to light.  Neck: Normal range of motion. Neck supple. No thyromegaly present.  Cardiovascular: Normal rate, regular rhythm, normal heart sounds and intact distal pulses.   No murmur heard. Pulmonary/Chest: Effort normal and breath sounds normal. No respiratory distress. She has no wheezes.  Abdominal: Soft. Bowel sounds are normal. She exhibits no distension. There is no tenderness.  Musculoskeletal: Normal range of motion. She exhibits no edema or tenderness.  Neurological: She is alert and oriented to person, place, and time. She has normal reflexes. No cranial nerve deficit.  Skin: Skin is warm and dry.  Psychiatric: She has a normal mood and affect. Her behavior is normal. Judgment and thought content normal.  Vitals reviewed.   BP 116/82 mmHg  Pulse 76  Temp(Src) 97.3 F (36.3 C)  Ht  (1.626 m)  Wt 133 lb (60.328 kg)  BMI 22.82 kg/m2       Assessment & Plan:  1. Racing heart beat - EKG 12-Lead  2. GAD (generalized anxiety disorder) -Pt started on lexapro 10  mg today -Stress management discussed -Avoid caffeine  -RTO in 4  weeks - escitalopram (LEXAPRO) 10 MG tablet; Take 1 tablet (10 mg total) by mouth daily.  Dispense: 90 tablet; Refill: 3  Jannifer Rodneyhristy Jacqualin Shirkey, FNP

## 2015-05-21 NOTE — Patient Instructions (Addendum)
Generalized Anxiety Disorder Generalized anxiety disorder (GAD) is a mental disorder. It interferes with life functions, including relationships, work, and school. GAD is different from normal anxiety, which everyone experiences at some point in their lives in response to specific life events and activities. Normal anxiety actually helps us prepare for and get through these life events and activities. Normal anxiety goes away after the event or activity is over.  GAD causes anxiety that is not necessarily related to specific events or activities. It also causes excess anxiety in proportion to specific events or activities. The anxiety associated with GAD is also difficult to control. GAD can vary from mild to severe. People with severe GAD can have intense waves of anxiety with physical symptoms (panic attacks).  SYMPTOMS The anxiety and worry associated with GAD are difficult to control. This anxiety and worry are related to many life events and activities and also occur more days than not for 6 months or longer. People with GAD also have three or more of the following symptoms (one or more in children):  Restlessness.   Fatigue.  Difficulty concentrating.   Irritability.  Muscle tension.  Difficulty sleeping or unsatisfying sleep. DIAGNOSIS GAD is diagnosed through an assessment by your health care provider. Your health care provider will ask you questions aboutyour mood,physical symptoms, and events in your life. Your health care provider may ask you about your medical history and use of alcohol or drugs, including prescription medicines. Your health care provider may also do a physical exam and blood tests. Certain medical conditions and the use of certain substances can cause symptoms similar to those associated with GAD. Your health care provider may refer you to a mental health specialist for further evaluation. TREATMENT The following therapies are usually used to treat GAD:    Medication. Antidepressant medication usually is prescribed for long-term daily control. Antianxiety medicines may be added in severe cases, especially when panic attacks occur.   Talk therapy (psychotherapy). Certain types of talk therapy can be helpful in treating GAD by providing support, education, and guidance. A form of talk therapy called cognitive behavioral therapy can teach you healthy ways to think about and react to daily life events and activities.  Stress managementtechniques. These include yoga, meditation, and exercise and can be very helpful when they are practiced regularly. A mental health specialist can help determine which treatment is best for you. Some people see improvement with one therapy. However, other people require a combination of therapies.   This information is not intended to replace advice given to you by your health care provider. Make sure you discuss any questions you have with your health care provider.   Document Released: 06/19/2012 Document Revised: 03/15/2014 Document Reviewed: 06/19/2012 Elsevier Interactive Patient Education [employer]2016 [employer] Palpitations A palpitation is the feeling that your heartbeat is irregular or is faster than normal. It may feel like your heart is fluttering or skipping a beat. Palpitations are usually not a serious problem. However, in some cases, you may need further medical evaluation. CAUSES  Palpitations can be caused by:  Smoking.  Caffeine or other stimulants, such as diet pills or energy drinks.  Alcohol.  Stress and anxiety.  Strenuous physical activity.  Fatigue.  Certain medicines.  Heart disease, especially if you have a history of irregular heart rhythms (arrhythmias), such as atrial fibrillation, atrial flutter, or supraventricular tachycardia.  An improperly working pacemaker or defibrillator. DIAGNOSIS  To find the cause of your palpitations, your health care provider will  take your medical  history and perform a physical exam. Your health care provider may also have you take a test called an ambulatory electrocardiogram (ECG). An ECG records your heartbeat patterns over a 24-hour period. You may also have other tests, such as:  Transthoracic echocardiogram (TTE). During echocardiography, sound waves are used to evaluate how blood flows through your heart.  Transesophageal echocardiogram (TEE).  Cardiac monitoring. This allows your health care provider to monitor your heart rate and rhythm in real time.  Holter monitor. This is a portable device that records your heartbeat and can help diagnose heart arrhythmias. It allows your health care provider to track your heart activity for several days, if needed.  Stress tests by exercise or by giving medicine that makes the heart beat faster. TREATMENT  Treatment of palpitations depends on the cause of your symptoms and can vary greatly. Most cases of palpitations do not require any treatment other than time, relaxation, and monitoring your symptoms. Other causes, such as atrial fibrillation, atrial flutter, or supraventricular tachycardia, usually require further treatment. HOME CARE INSTRUCTIONS   Avoid:  Caffeinated coffee, tea, soft drinks, diet pills, and energy drinks.  Chocolate.  Alcohol.  Stop smoking if you smoke.  Reduce your stress and anxiety. Things that can help you relax include:  A method of controlling things in your body, such as your heartbeats, with your mind (biofeedback).  Yoga.  Meditation.  Physical activity such as swimming, jogging, or walking.  Get plenty of rest and sleep. SEEK MEDICAL CARE IF:   You continue to have a fast or irregular heartbeat beyond 24 hours.  Your palpitations occur more often. SEEK IMMEDIATE MEDICAL CARE IF:  You have chest pain or shortness of breath.  You have a severe headache.  You feel dizzy or you faint. MAKE SURE YOU:  Understand these  instructions.  Will watch your condition.  Will get help right away if you are not doing well or get worse.   This information is not intended to replace advice given to you by your health care provider. Make sure you discuss any questions you have with your health care provider.   Document Released: 02/20/2000 Document Revised: 02/27/2013 Document Reviewed: 04/23/2011 Elsevier Interactive Patient Education [employer]

## 2015-05-28 ENCOUNTER — Encounter: Payer: Self-pay | Admitting: Family Medicine

## 2015-05-28 ENCOUNTER — Ambulatory Visit (INDEPENDENT_AMBULATORY_CARE_PROVIDER_SITE_OTHER): Payer: BC Managed Care – PPO | Admitting: Family Medicine

## 2015-05-28 VITALS — BP 120/75 | HR 68 | Temp 98.5°F | Ht 64.0 in | Wt 132.8 lb

## 2015-05-28 DIAGNOSIS — J019 Acute sinusitis, unspecified: Secondary | ICD-10-CM

## 2015-05-28 MED ORDER — FLUTICASONE PROPIONATE 50 MCG/ACT NA SUSP: 1.0000 | Freq: Two times a day (BID) | NASAL | Status: DC | PRN

## 2015-05-28 MED ORDER — AZITHROMYCIN 250 MG PO TABS: ORAL_TABLET | ORAL | Status: DC

## 2015-05-28 NOTE — Progress Notes (Signed)
BP 120/75 mmHg  Pulse 68  Temp(Src) 98.5 F (36.9 C) (Oral)  Ht 5\' 4"  (1.626 m)  Wt 132 lb 12.8 oz (60.238 kg)  BMI 22.78 kg/m2  LMP 05/21/2015 (Approximate)   Subjective:    Patient ID: Cassandra Estrada, female    DOB: 05/08/1975, 40 y.o.   MRN: [id number]  HPI: Cassandra BlossomMeredith R Pflieger is a 40 y.o. female presenting on 05/28/2015 for Sinusitis   HPI Sinus congestion and cough and postnasal drainage Patient has been having persistent sinus congestion and cough and postnasal drainage that has been going on for a week. She is also started developing some pressure in her ears on both sides over the last couple days. She denies any fevers or chills or shortness of breath or wheezing. She is to get these recurrently a lot but hasn't gotten them as much since. She started using Tylenol Sinus and cold but it is not helping too much. 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  Relevant past medical, surgical, family and social history reviewed and updated as indicated. Interim medical history since our last visit reviewed. Allergies and medications reviewed and updated.  Review of Systems  Constitutional: Negative for fever and chills.  HENT: Positive for congestion, postnasal drip, rhinorrhea, sinus pressure, sneezing and sore throat. Negative for ear discharge and ear pain.   Eyes: Negative for pain, redness and visual disturbance.  Respiratory: Positive for cough. Negative for chest tightness and shortness of breath.   Cardiovascular: Negative for chest pain and leg swelling.  Genitourinary: Negative for dysuria and difficulty urinating.  Musculoskeletal: Negative for back pain and gait problem.  Skin: Negative for rash.  Neurological: Negative for light-headedness and headaches.  Psychiatric/Behavioral: Negative for behavioral problems and agitation.  All other systems reviewed and are negative.   Per HPI unless specifically  indicated above     Medication List       This list is accurate as of: 05/28/15 12:30 PM.  Always use your most recent med list.               ADVIL SINUS CONGESTION & PAIN 10-200 MG Tabs  Generic drug:  Phenylephrine-Ibuprofen  Take by mouth.     azithromycin 250 MG tablet  Commonly known as:  ZITHROMAX  Take 2 the first day and then one each day after.     escitalopram 10 MG tablet  Commonly known as:  LEXAPRO  Take 1 tablet (10 mg total) by mouth daily.     fluticasone 50 MCG/ACT nasal spray  Commonly known as:  FLONASE  Place 1 spray into both nostrils 2 (two) times daily as needed for allergies or rhinitis.     VYVANSE 60 MG capsule  Generic drug:  lisdexamfetamine  Reported on 05/28/2015           Objective:    BP 120/75 mmHg  Pulse 68  Temp(Src) 98.5 F (36.9 C) (Oral)  Ht 5\' 4"  (1.626 m)  Wt 132 lb 12.8 oz (60.238 kg)  BMI 22.78 kg/m2  LMP 05/21/2015 (Approximate)  Wt Readings from Last 3 Encounters:  05/28/15 132 lb 12.8 oz (60.238 kg)  05/21/15 133 lb (60.328 kg)  06/29/13 133 lb (60.328 kg)    Physical Exam  Constitutional: She is oriented to person, place, and time. She appears well-developed and well-nourished. No distress.  HENT:  Right Ear: Tympanic membrane, external ear and ear canal normal.  Left Ear: Tympanic membrane, external ear and ear canal normal.  Nose: Mucosal edema and rhinorrhea present. No epistaxis. Right sinus exhibits no maxillary sinus tenderness and no frontal sinus tenderness. Left sinus exhibits no maxillary sinus tenderness and no frontal sinus tenderness.  Mouth/Throat: Uvula is midline and mucous membranes are normal. Posterior oropharyngeal edema and posterior oropharyngeal erythema present. No oropharyngeal exudate or tonsillar abscesses.  Eyes: Conjunctivae and EOM are normal.  Neck: Neck supple. No thyromegaly present.  Cardiovascular: Normal rate, regular rhythm, normal heart sounds and intact distal pulses.   No  murmur heard. Pulmonary/Chest: Effort normal and breath sounds normal. No respiratory distress. She has no wheezes.  Musculoskeletal: Normal range of motion. She exhibits no edema or tenderness.  Lymphadenopathy:    She has no cervical adenopathy.  Neurological: She is alert and oriented to person, place, and time. Coordination normal.  Skin: Skin is warm and dry. No rash noted. She is not diaphoretic.  Psychiatric: She has a normal mood and affect. Her behavior is normal.  Vitals reviewed.  Results for orders placed or performed in visit on 09/24/14  Cytology - PAP  Result Value Ref Range   CYTOLOGY - PAP PAP RESULT       Assessment & Plan:   Problem List Items Addressed This Visit    None    Visit Diagnoses    Acute rhinosinusitis    -  Primary    Persistent for 1 week, use Mucinex, Flonase, antihistamine, sent Z-Pak as well    Relevant Medications    Phenylephrine-Ibuprofen (ADVIL SINUS CONGESTION & PAIN) 10-200 MG TABS    azithromycin (ZITHROMAX) 250 MG tablet    fluticasone (FLONASE) 50 MCG/ACT nasal spray       Follow up plan: Return if symptoms worsen or fail to improve.  Counseling provided for all of the vaccine components No orders of the defined types were placed in this encounter.    Arville Care, MD [facility] 05/28/2015, 12:30 PM

## 2015-06-19 ENCOUNTER — Ambulatory Visit: Payer: Self-pay | Admitting: Family

## 2015-10-01 ENCOUNTER — Other Ambulatory Visit: Payer: Self-pay | Admitting: Obstetrics and Gynecology

## 2015-10-02 LAB — CYTOLOGY - PAP

## 2016-02-29 IMAGING — MG MM DIGITAL DIAGNOSTIC BILAT
4 series · 4 of 4 positions shown · non-contrast
Comparison: 05/07/2008

CLINICAL DATA: Bilateral diffuse intermittent breast pain occurring
with menstrual cycle

EXAM:
DIGITAL DIAGNOSTIC  BILATERAL MAMMOGRAM WITH CAD

[L CC]
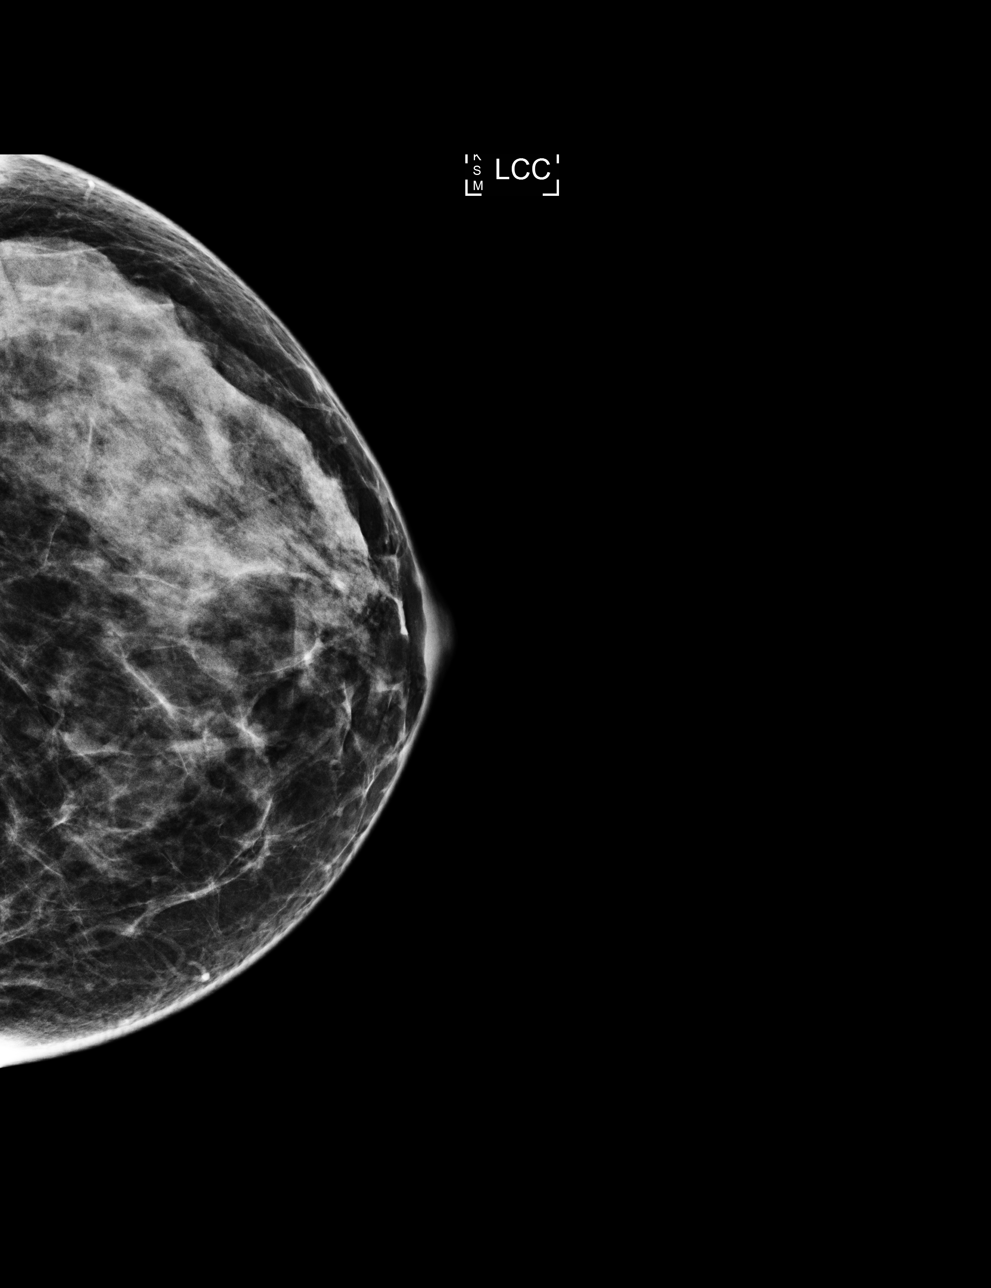

[L MLO]
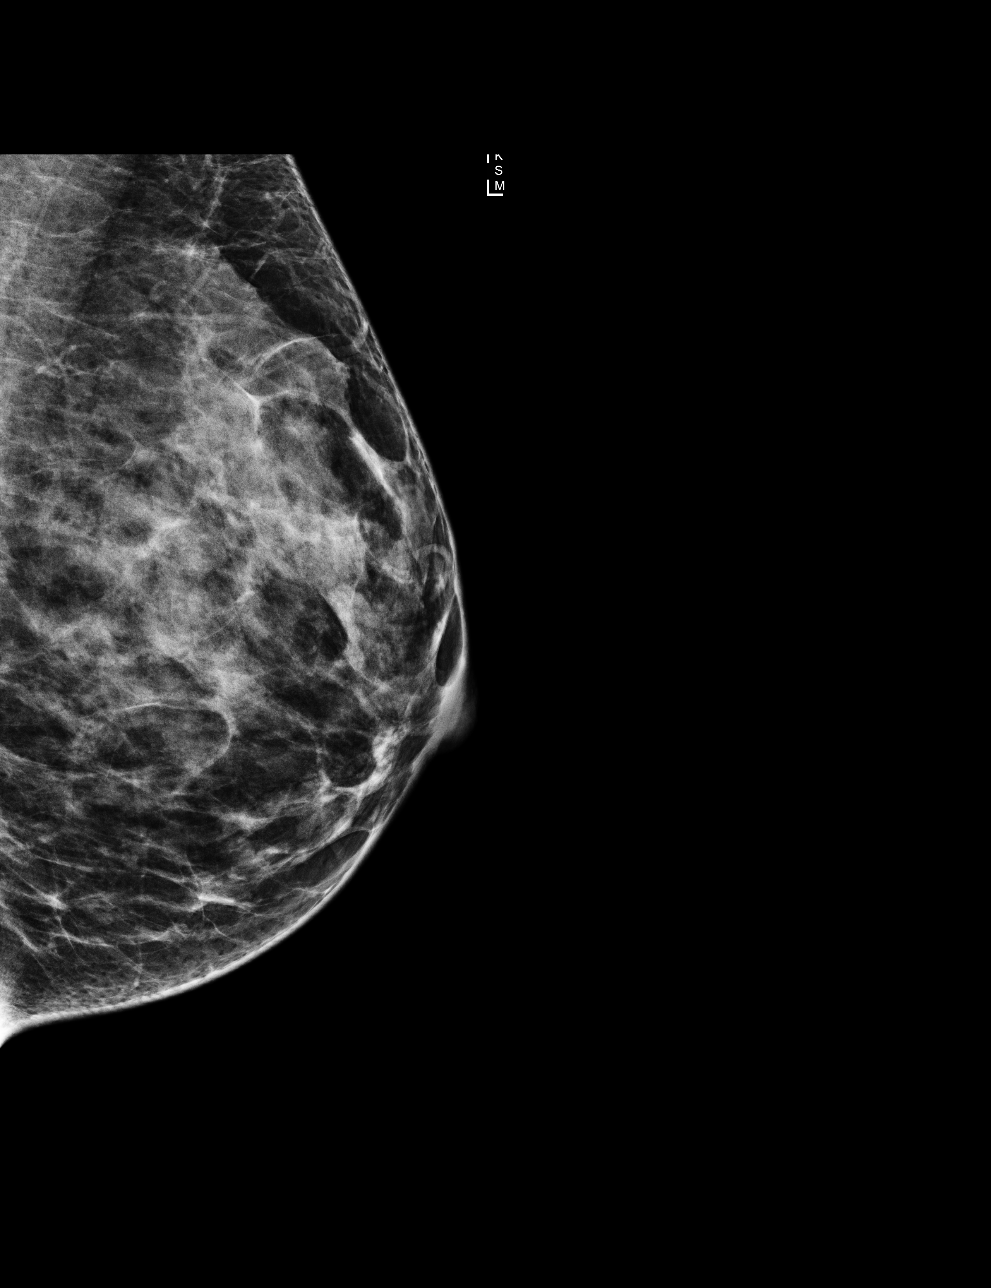

[R CC]
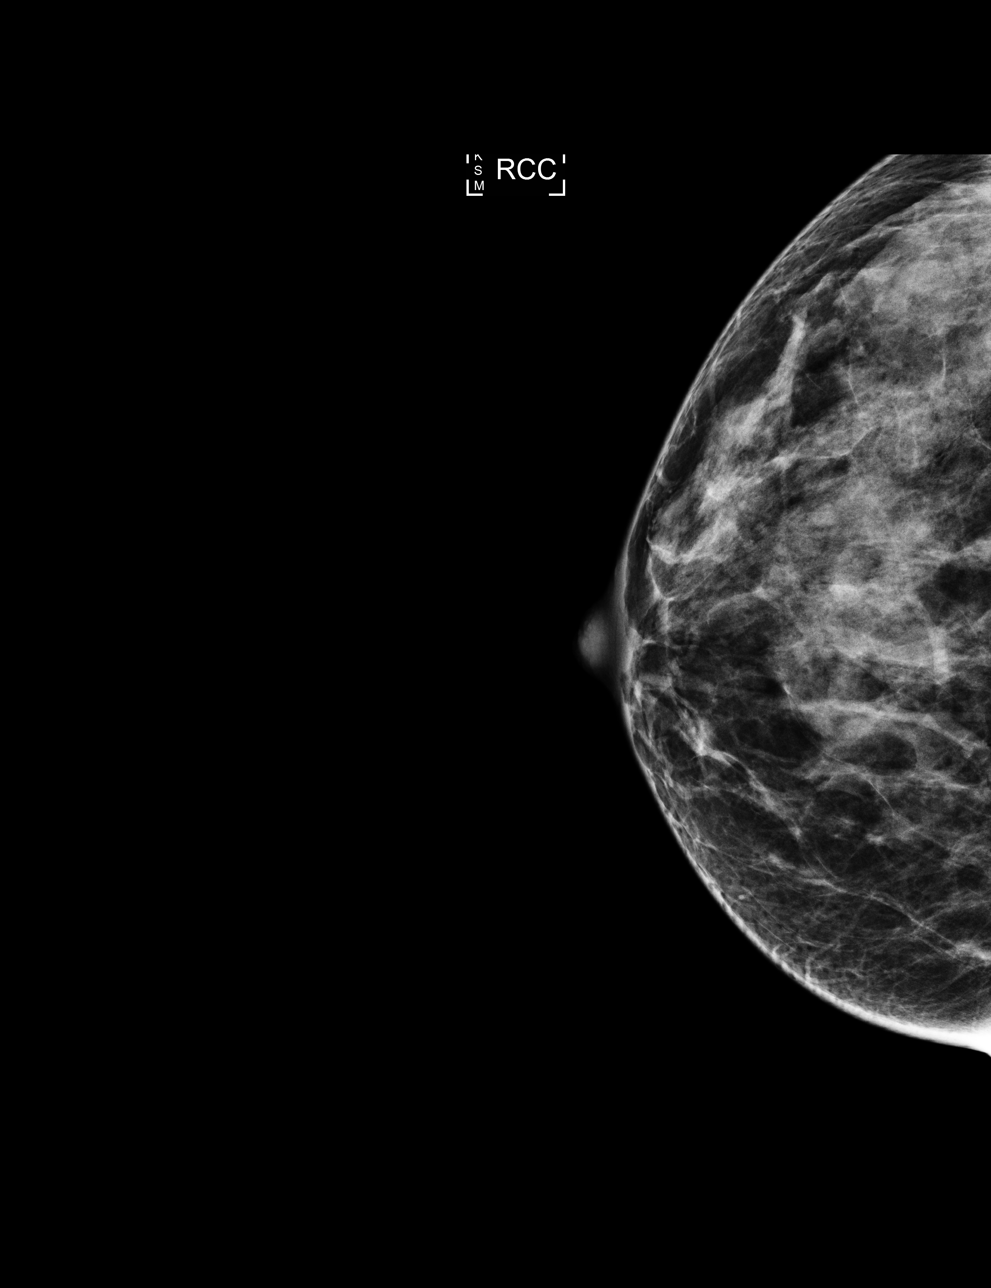

[R MLO]
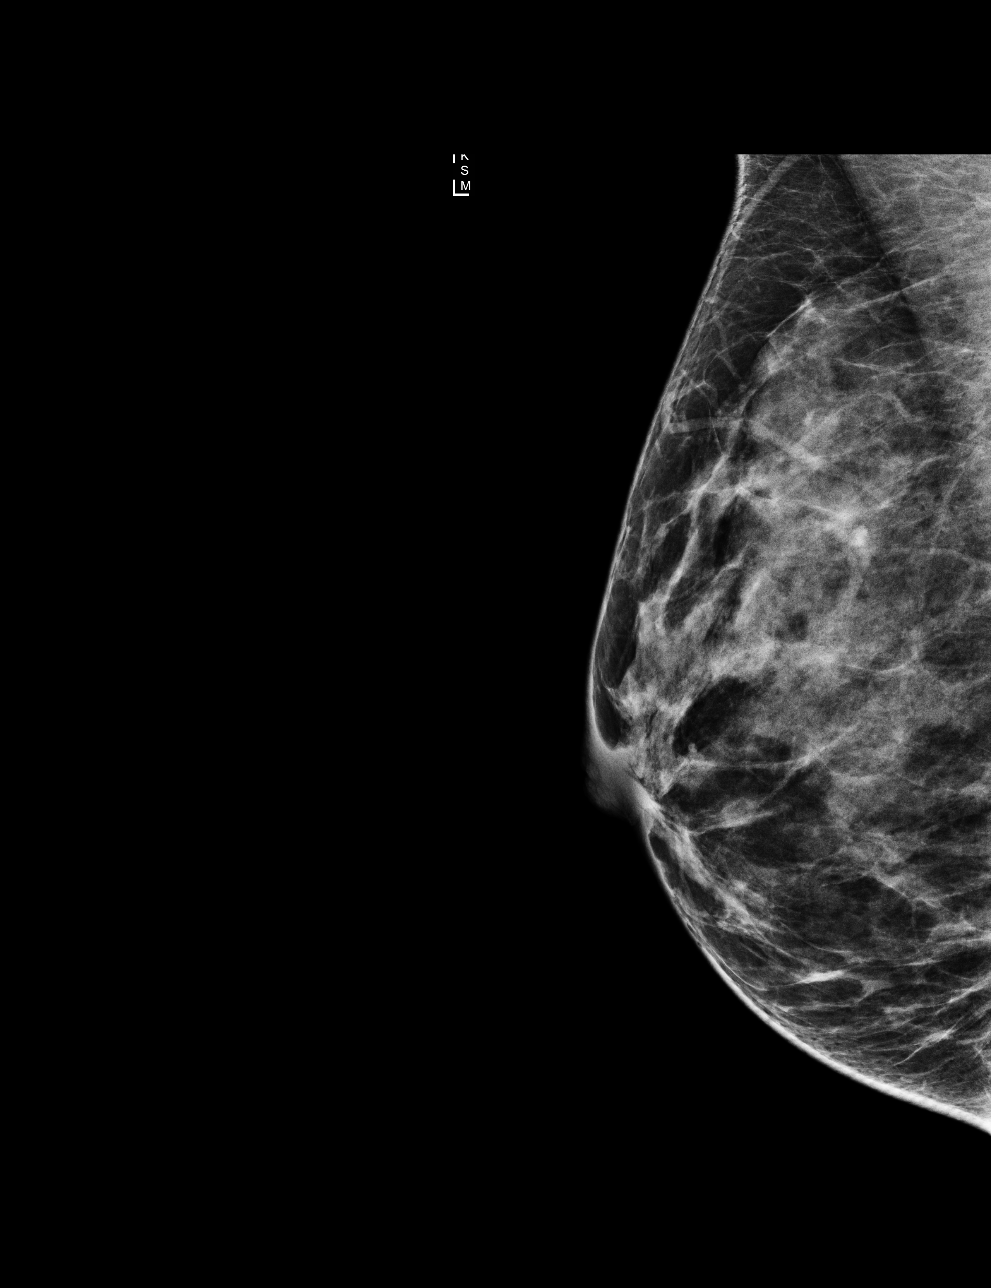

[4 of 4 positions shown; findings below may reference images not displayed]

ACR Breast Density Category c: The breast tissue is heterogeneously
dense, which may obscure small masses.
FINDINGS: There are no mammographic abnormalities on either side. There is no
significant change from the prior examination.

Mammographic images were processed with CAD.
IMPRESSION: Negative

RECOMMENDATION:
Screening mammogram in 1 year

I have discussed the findings and recommendations with the patient.
Results were also provided in writing at the conclusion of the
visit. If applicable, a reminder letter will be sent to the patient
regarding the next appointment.

BI-RADS CATEGORY  1: Negative

## 2016-06-08 ENCOUNTER — Ambulatory Visit (INDEPENDENT_AMBULATORY_CARE_PROVIDER_SITE_OTHER): Payer: BC Managed Care – PPO | Admitting: Nurse Practitioner

## 2016-06-08 ENCOUNTER — Encounter: Payer: Self-pay | Admitting: Nurse Practitioner

## 2016-06-08 VITALS — BP 128/78 | HR 70 | Temp 97.3°F | Ht 64.0 in | Wt 147.0 lb

## 2016-06-08 DIAGNOSIS — J0101 Acute recurrent maxillary sinusitis: Secondary | ICD-10-CM | POA: Diagnosis not present

## 2016-06-08 DIAGNOSIS — F411 Generalized anxiety disorder: Secondary | ICD-10-CM | POA: Diagnosis not present

## 2016-06-08 MED ORDER — AMOXICILLIN 875 MG PO TABS: 875.0000 mg | ORAL_TABLET | Freq: Two times a day (BID) | ORAL | 0 refills | Status: DC

## 2016-06-08 MED ORDER — CHLORPHEN-PE-ACETAMINOPHEN 4-10-325 MG PO TABS: 1.0000 | ORAL_TABLET | Freq: Four times a day (QID) | ORAL | 0 refills | Status: DC | PRN

## 2016-06-08 MED ORDER — ESCITALOPRAM OXALATE 10 MG PO TABS: 10.0000 mg | ORAL_TABLET | Freq: Every day | ORAL | 3 refills | Status: AC

## 2016-06-08 NOTE — Addendum Note (Signed)
Addended by: Bennie Pierini on: 06/08/2016 11:53 AM   Modules accepted: Orders

## 2016-06-08 NOTE — Progress Notes (Signed)
Subjective:     Cassandra Estrada is a 41 y.o. female who presents for evaluation of sinus pain. Symptoms include: clear rhinorrhea, congestion, cough, headaches, nasal congestion, sinus pressure and sore throat. Onset of symptoms was 7 days ago. Symptoms have been unchanged since that time. Past history is significant for no history of pneumonia or bronchitis. Patient is a non-smoker.  The following portions of the patient's history were reviewed and updated as appropriate: allergies, current medications, past family history, past medical history, past social history, past surgical history and problem list.  Review of Systems Pertinent items noted in HPI and remainder of comprehensive ROS otherwise negative.   Objective:    BP 128/78   Pulse 70   Temp 97.3 F (36.3 C) (Oral)   Ht  (1.626 m)   Wt 147 lb (66.7 kg)   BMI 25.23 kg/m  General appearance: alert and cooperative Eyes: conjunctivae/corneas clear. PERRL, EOM's intact. Fundi benign. Ears: normal TM's and external ear canals both ears Nose: clear discharge, mild congestion, turbinates red, sinus tenderness bilateral Throat: lips, mucosa, and tongue normal; teeth and gums normal Neck: no adenopathy, no carotid bruit, no JVD, supple, symmetrical, trachea midline and thyroid not enlarged, symmetric, no tenderness/mass/nodules Lungs: clear to auscultation bilaterally Heart: regular rate and rhythm, S1, S2 normal, no murmur, click, rub or gallop    Assessment:    Acute bacterial sinusitis.    Plan:     1. Take meds as prescribed 2. Use a cool mist humidifier especially during the winter months and when heat has been humid. 3. Use saline nose sprays frequently 4. Saline irrigations of the nose can be very helpful if done frequently.  * 4X daily for 1 week*  * Use of a nettie pot can be helpful with this. Follow directions with this* 5. Drink plenty of fluids 6. Keep thermostat turn down low 7.For any cough or  congestion  Use plain Mucinex- regular strength or max strength is fine   * Children- consult with Pharmacist for dosing 8. For fever or aces or pains- take tylenol or ibuprofen appropriate for age and weight.  * for fevers greater than 101 orally you may alternate ibuprofen and tylenol every  3 hours.   Meds ordered this encounter  Medications  . amoxicillin (AMOXIL) 875 MG tablet    Sig: Take 1 tablet (875 mg total) by mouth 2 (two) times daily. 1 po BID    Dispense:  20 tablet    Refill:  0    Order Specific Question:   Supervising Provider    Answer:   VINCENT, CAROL L [[id number]]  . Chlorphen-PE-Acetaminophen 4-10-325 MG TABS    Sig: Take 1 tablet by mouth every 6 (six) hours as needed.    Dispense:  10 tablet    Refill:  0    Order Specific Question:   Supervising Provider    Answer:   Johna Sheriff [[id number]]   Mary-Margaret Daphine Deutscher, FNP

## 2016-06-08 NOTE — Patient Instructions (Signed)

## 2017-05-02 ENCOUNTER — Ambulatory Visit: Payer: Self-pay | Admitting: Nurse Practitioner

## 2017-05-02 DIAGNOSIS — Z23 Encounter for immunization: Secondary | ICD-10-CM

## 2017-06-09 ENCOUNTER — Other Ambulatory Visit: Payer: Self-pay | Admitting: Nurse Practitioner

## 2017-06-09 DIAGNOSIS — F411 Generalized anxiety disorder: Secondary | ICD-10-CM

## 2017-07-12 ENCOUNTER — Other Ambulatory Visit: Payer: Self-pay | Admitting: Nurse Practitioner

## 2017-07-12 DIAGNOSIS — F411 Generalized anxiety disorder: Secondary | ICD-10-CM

## 2018-02-06 ENCOUNTER — Ambulatory Visit: Payer: BC Managed Care – PPO | Admitting: Family Medicine

## 2018-02-06 VITALS — BP 118/75 | HR 66 | Temp 97.8°F | Ht 64.0 in | Wt 150.0 lb

## 2018-02-06 DIAGNOSIS — R0781 Pleurodynia: Secondary | ICD-10-CM | POA: Diagnosis not present

## 2018-02-06 MED ORDER — NAPROXEN 500 MG PO TABS: 500.0000 mg | ORAL_TABLET | Freq: Two times a day (BID) | ORAL | 0 refills | Status: DC

## 2018-02-06 NOTE — Progress Notes (Signed)
Subjective: CC: heart murmur PCP: Bennie PieriniMartin, Mary-Margaret, FNP RUE:[id number]:Cassandra Estrada is a 42 y.o. female presenting to clinic today for:  1. Tightness in abdomen Patient reports that she has had a tightness in the left upper abdomen/left lower rib cage.  This occurred sometime ago and was thought to be related to stress.  She notes after being started on Lexapro she had not had many symptoms since.  However, last Friday morning she started developing the symptoms again.  At first, she thought it may be related to acid reflux.  Though she denies any nausea, vomiting, burning in her throat or bloating.  Denies any preceding injury.  She has not lifted or done any unusual activities.  She denies any chest pain, shortness of breath, pleuritic chest pain or change in activity tolerance.  She has had some soft stools but no overt diarrhea.  No constipation.  She is not tried any medications to relieve symptoms yet. No famhx of CAD.   ROS: Per HPI  No Known Allergies Past Medical History:  Diagnosis Date  . ADD (attention deficit disorder)     Current Outpatient Medications:  .  escitalopram (LEXAPRO) 10 MG tablet, Take 1 tablet (10 mg total) by mouth daily., Disp: 90 tablet, Rfl: 3 .  VYVANSE 60 MG capsule, 50 mg. , Disp: , Rfl: 0 Social History   Socioeconomic History  . Marital status: Married    Spouse name: Not on file  . Number of children: Not on file  . Years of education: Not on file  . Highest education level: Not on file  Occupational History  . Not on file  Social Needs  . Financial resource strain: Not on file  . Food insecurity:    Worry: Not on file    Inability: Not on file  . Transportation needs:    Medical: Not on file    Non-medical: Not on file  Tobacco Use  . Smoking status: Never Smoker  . Smokeless tobacco: Never Used  Substance and Sexual Activity  . Alcohol use: Yes  . Drug use: No  . Sexual activity: Yes  Lifestyle  . Physical activity:    Days  per week: Not on file    Minutes per session: Not on file  . Stress: Not on file  Relationships  . Social connections:    Talks on phone: Not on file    Gets together: Not on file    Attends religious service: Not on file    Active member of club or organization: Not on file    Attends meetings of clubs or organizations: Not on file    Relationship status: Not on file  . Intimate partner violence:    Fear of current or ex partner: Not on file    Emotionally abused: Not on file    Physically abused: Not on file    Forced sexual activity: Not on file  Other Topics Concern  . Not on file  Social History Narrative   ** Merged History Encounter **       Family History  Problem Relation Age of Onset  . Hyperlipidemia Mother     Objective: Office vital signs reviewed. BP 118/75   Pulse 66   Temp 97.8 F (36.6 C) (Oral)   Ht 5\' 4"  (1.626 m)   Wt 150 lb (68 kg)   BMI 25.75 kg/m   Physical Examination:  General: Awake, alert, well nourished, No acute distress HEENT: Normal    Eyes:  PERRLA, extraocular membranes intact, sclera white    Throat: moist mucus membranes Cardio: regular rate and rhythm, S1S2 heard, no murmurs appreciated Pulm: clear to auscultation bilaterally, no wheezes, rhonchi or rales; normal work of breathing on room air Ribs: no TTP over area of concern (left ribs 8-10) GI: soft, non-tender, non-distended, bowel sounds present x4, no hepatomegaly, no splenomegaly, no masses GU: no suprapubic TTP  Assessment/ Plan: 42 y.o. female   1. Rib pain on left side Nothing on exam to suggest GI or cardiac etiology.  I am concerned for possible costochondritis.  Though difficult to tell what her inciting injury is.  I recommended trial of naproxen p.o. twice daily with food.  If symptoms are not improving at all over the next several days, may consider trial of PPI to rule out gastric etiology.  Reasons for return and emergent evaluation the emergency department  discussed.  She voiced good understanding.  She is to follow with PCP for ongoing needs.   No orders of the defined types were placed in this encounter.  Meds ordered this encounter  Medications  . naproxen (NAPROSYN) 500 MG tablet    Sig: Take 1 tablet (500 mg total) by mouth 2 (two) times daily with a meal. (as needed for rib pain)    Dispense:  30 tablet    Refill:  0     Ladell Bey Hulen Skains, DO [facility] [phone]

## 2018-02-06 NOTE — Patient Instructions (Signed)
I have prescribed you Naprosyn to take twice daily with a meal if needed for rib pain.  I do think this is more musculoskeletal nature.  Your physical exam did not suggest gastric or cardiac etiology.  We discussed that if symptoms do not significantly improve on the anti-inflammatory, we could consider a trial of a stomach acid medication like Prilosec or Protonix.  Keep me informed about how you do and if we need to change our direction we will.  If you develop any other worrisome symptoms or signs that we discussed, please seek immediate medical attention.  Otherwise, plan to follow-up with Cassandra Estrada as scheduled for routine care.  Costochondritis Costochondritis is swelling and irritation (inflammation) of the tissue (cartilage) that connects your ribs to your breastbone (sternum). This causes pain in the front of your chest. The pain usually starts gradually and involves more than one rib. What are the causes? The exact cause of this condition is not always known. It results from stress on the cartilage where your ribs attach to your sternum. The cause of this stress could be:  Chest injury (trauma).  Exercise or activity, such as lifting.  Severe coughing.  What increases the risk? You may be at higher risk for this condition if you:  Are female.  Are 50?43 years old.  Recently started a new exercise or work activity.  Have low levels of vitamin D.  Have a condition that makes you cough frequently.  What are the signs or symptoms? The main symptom of this condition is chest pain. The pain:  Usually starts gradually and can be sharp or dull.  Gets worse with deep breathing, coughing, or exercise.  Gets better with rest.  May be worse when you press on the sternum-rib connection (tenderness).  How is this diagnosed? This condition is diagnosed based on your symptoms, medical history, and a physical exam. Your health care provider will check for tenderness when pressing on  your sternum. This is the most important finding. You may also have tests to rule out other causes of chest pain. These may include:  A chest X-ray to check for lung problems.  An electrocardiogram (ECG) to see if you have a heart problem that could be causing the pain.  An imaging scan to rule out a chest or rib fracture.  How is this treated? This condition usually goes away on its own over time. Your health care provider may prescribe an NSAID to reduce pain and inflammation. Your health care provider may also suggest that you:  Rest and avoid activities that make pain worse.  Apply heat or cold to the area to reduce pain and inflammation.  Do exercises to stretch your chest muscles.  If these treatments do not help, your health care provider may inject a numbing medicine at the sternum-rib connection to help relieve the pain. Follow these instructions at home:  Avoid activities that make pain worse. This includes any activities that use chest, abdominal, and side muscles.  If directed, put ice on the painful area: ? Put ice in a plastic bag. ? Place a towel between your skin and the bag. ? Leave the ice on for 20 minutes, 2-3 times a day.  If directed, apply heat to the affected area as often as told by your health care provider. Use the heat source that your health care provider recommends, such as a moist heat pack or a heating pad. ? Place a towel between your skin and the heat  source. ? Leave the heat on for 20-30 minutes. ? Remove the heat if your skin turns bright red. This is especially important if you are unable to feel pain, heat, or cold. You may have a greater risk of getting burned.  Take over-the-counter and prescription medicines only as told by your health care provider.  Return to your normal activities as told by your health care provider. Ask your health care provider what activities are safe for you.  Keep all follow-up visits as told by your health care  provider. This is important. Contact a health care provider if:  You have chills or a fever.  Your pain does not go away or it gets worse.  You have a cough that does not go away (is persistent). Get help right away if:  You have shortness of breath. This information is not intended to replace advice given to you by your health care provider. Make sure you discuss any questions you have with your health care provider. Document Released: 12/02/2004 Document Revised: 09/12/2015 Document Reviewed: 06/18/2015 Elsevier Interactive Patient Education  [employer]2018 [employer]

## 2022-02-15 ENCOUNTER — Other Ambulatory Visit (HOSPITAL_BASED_OUTPATIENT_CLINIC_OR_DEPARTMENT_OTHER): Payer: Self-pay

## 2022-02-15 MED ORDER — LISDEXAMFETAMINE DIMESYLATE 60 MG PO CAPS
60.0000 mg | ORAL_CAPSULE | Freq: Every day | ORAL | 0 refills | Status: DC
  Filled 2022-02-15: qty 30, 30d supply, fill #0

## 2022-03-18 ENCOUNTER — Other Ambulatory Visit (HOSPITAL_BASED_OUTPATIENT_CLINIC_OR_DEPARTMENT_OTHER): Payer: Self-pay

## 2022-03-18 MED ORDER — LISDEXAMFETAMINE DIMESYLATE 60 MG PO CAPS
60.0000 mg | ORAL_CAPSULE | Freq: Every day | ORAL | 0 refills | Status: DC
  Filled 2022-03-18: qty 30, 30d supply, fill #0

## 2022-04-20 ENCOUNTER — Other Ambulatory Visit (HOSPITAL_BASED_OUTPATIENT_CLINIC_OR_DEPARTMENT_OTHER): Payer: Self-pay

## 2022-04-20 MED ORDER — LISDEXAMFETAMINE DIMESYLATE 60 MG PO CAPS
60.0000 mg | ORAL_CAPSULE | Freq: Every day | ORAL | 0 refills | Status: DC
  Filled 2022-04-20: qty 30, 30d supply, fill #0

## 2022-05-24 ENCOUNTER — Other Ambulatory Visit (HOSPITAL_BASED_OUTPATIENT_CLINIC_OR_DEPARTMENT_OTHER): Payer: Self-pay

## 2022-05-24 MED ORDER — LISDEXAMFETAMINE DIMESYLATE 60 MG PO CAPS
60.0000 mg | ORAL_CAPSULE | Freq: Every day | ORAL | 0 refills | Status: DC
  Filled 2022-05-24: qty 30, 30d supply, fill #0

## 2022-06-25 ENCOUNTER — Other Ambulatory Visit (HOSPITAL_BASED_OUTPATIENT_CLINIC_OR_DEPARTMENT_OTHER): Payer: Self-pay

## 2022-06-25 MED ORDER — LISDEXAMFETAMINE DIMESYLATE 60 MG PO CAPS
60.0000 mg | ORAL_CAPSULE | Freq: Every day | ORAL | 0 refills | Status: DC
  Filled 2022-06-25: qty 30, 30d supply, fill #0

## 2022-07-29 ENCOUNTER — Other Ambulatory Visit (HOSPITAL_BASED_OUTPATIENT_CLINIC_OR_DEPARTMENT_OTHER): Payer: Self-pay

## 2022-07-29 MED ORDER — LISDEXAMFETAMINE DIMESYLATE 60 MG PO CAPS
60.0000 mg | ORAL_CAPSULE | Freq: Every day | ORAL | 0 refills | Status: DC
  Filled 2022-07-29: qty 30, 30d supply, fill #0

## 2022-08-26 ENCOUNTER — Other Ambulatory Visit (HOSPITAL_BASED_OUTPATIENT_CLINIC_OR_DEPARTMENT_OTHER): Payer: Self-pay

## 2022-08-26 MED ORDER — LISDEXAMFETAMINE DIMESYLATE 60 MG PO CAPS
60.0000 mg | ORAL_CAPSULE | Freq: Every day | ORAL | 0 refills | Status: DC
  Filled 2022-08-26: qty 30, 30d supply, fill #0

## 2022-09-28 ENCOUNTER — Other Ambulatory Visit (HOSPITAL_BASED_OUTPATIENT_CLINIC_OR_DEPARTMENT_OTHER): Payer: Self-pay

## 2022-09-28 MED ORDER — LISDEXAMFETAMINE DIMESYLATE 60 MG PO CAPS
60.0000 mg | ORAL_CAPSULE | Freq: Every day | ORAL | 0 refills | Status: DC
  Filled 2022-09-28: qty 30, 30d supply, fill #0

## 2022-09-30 ENCOUNTER — Other Ambulatory Visit (HOSPITAL_BASED_OUTPATIENT_CLINIC_OR_DEPARTMENT_OTHER): Payer: Self-pay

## 2022-10-29 ENCOUNTER — Other Ambulatory Visit (HOSPITAL_BASED_OUTPATIENT_CLINIC_OR_DEPARTMENT_OTHER): Payer: Self-pay

## 2022-10-29 MED ORDER — LISDEXAMFETAMINE DIMESYLATE 60 MG PO CAPS
ORAL_CAPSULE | ORAL | 0 refills | Status: DC
  Filled 2022-10-29: qty 30, 30d supply, fill #0

## 2022-11-30 ENCOUNTER — Other Ambulatory Visit (HOSPITAL_BASED_OUTPATIENT_CLINIC_OR_DEPARTMENT_OTHER): Payer: Self-pay

## 2022-11-30 MED ORDER — LISDEXAMFETAMINE DIMESYLATE 60 MG PO CAPS
60.0000 mg | ORAL_CAPSULE | Freq: Every day | ORAL | 0 refills | Status: DC
  Filled 2022-11-30: qty 30, 30d supply, fill #0

## 2022-12-07 ENCOUNTER — Encounter: Payer: Self-pay | Admitting: Internal Medicine

## 2023-01-03 ENCOUNTER — Other Ambulatory Visit (HOSPITAL_BASED_OUTPATIENT_CLINIC_OR_DEPARTMENT_OTHER): Payer: Self-pay

## 2023-01-03 MED ORDER — LISDEXAMFETAMINE DIMESYLATE 60 MG PO CAPS
60.0000 mg | ORAL_CAPSULE | Freq: Every day | ORAL | 0 refills | Status: DC
  Filled 2023-01-03: qty 30, 30d supply, fill #0

## 2023-01-14 ENCOUNTER — Ambulatory Visit (AMBULATORY_SURGERY_CENTER): Payer: BC Managed Care – PPO

## 2023-01-14 ENCOUNTER — Encounter: Payer: Self-pay | Admitting: Internal Medicine

## 2023-01-14 VITALS — Ht 64.0 in | Wt 148.0 lb

## 2023-01-14 DIAGNOSIS — Z1211 Encounter for screening for malignant neoplasm of colon: Secondary | ICD-10-CM

## 2023-01-14 NOTE — Progress Notes (Signed)

## 2023-01-31 ENCOUNTER — Encounter: Payer: BC Managed Care – PPO | Admitting: Internal Medicine

## 2023-02-01 ENCOUNTER — Other Ambulatory Visit (HOSPITAL_BASED_OUTPATIENT_CLINIC_OR_DEPARTMENT_OTHER): Payer: Self-pay

## 2023-02-01 MED ORDER — LISDEXAMFETAMINE DIMESYLATE 60 MG PO CAPS
60.0000 mg | ORAL_CAPSULE | Freq: Every day | ORAL | 0 refills | Status: DC
  Filled 2023-02-01: qty 30, 30d supply, fill #0

## 2023-03-03 NOTE — Progress Notes (Signed)
[facility] History and Physical   Primary Care Physician:  Ladora Daniel, PA-C   Reason for Procedure:   CRCA screening  Plan:    colonoscopy     HPI: Cassandra Estrada is a 47 y.o. female presenting for initial screening colonoscopy   Past Medical History:  Diagnosis Date   ADD (attention deficit disorder)    Heart murmur     Past Surgical History:  Procedure Laterality Date   CESAREAN SECTION  2002 and 2005   tubes in ears     TYMPANOSTOMY TUBE PLACEMENT  1978 and 1982    Prior to Admission medications   Medication Sig Start Date End Date Taking? Authorizing Provider  escitalopram (LEXAPRO) 10 MG tablet Take 1 tablet (10 mg total) by mouth daily. Patient taking differently: Take 20 mg by mouth daily. 06/08/16   Daphine Deutscher, Mary-Margaret, FNP  lisdexamfetamine (VYVANSE) 60 MG capsule Take 1 capsule (60 mg total) by mouth daily. 02/01/23     valACYclovir (VALTREX) 500 MG tablet Take 1 tablet by mouth 2 (two) times daily as needed.    [provider]    Current Outpatient Medications  Medication Sig Dispense Refill   escitalopram (LEXAPRO) 10 MG tablet Take 1 tablet (10 mg total) by mouth daily. (Patient taking differently: Take 20 mg by mouth daily.) 90 tablet 3   lisdexamfetamine (VYVANSE) 60 MG capsule Take 1 capsule (60 mg total) by mouth daily. 30 capsule 0   desonide (DESOWEN) 0.05 % ointment Apply topically.     triamcinolone ointment (KENALOG) 0.1 % apply to skin bid for 14 days     valACYclovir (VALTREX) 500 MG tablet Take 1 tablet by mouth 2 (two) times daily as needed.     Current Facility-Administered Medications  Medication Dose Route Frequency Provider Last Rate Last Admin   0.9 %  sodium chloride infusion  500 mL Intravenous Once Iva Boop, MD        Allergies as of 03/04/2023   (No Known Allergies)    Family History  Problem Relation Age of Onset   Colon polyps Mother    Hyperlipidemia Mother    Colon polyps Father     Colon cancer Neg Hx    Esophageal cancer Neg Hx    Rectal cancer Neg Hx    Stomach cancer Neg Hx     Social History   Socioeconomic History   Marital status: Married    Spouse name: Not on file   Number of children: Not on file   Years of education: Not on file   Highest education level: Not on file  Occupational History   Not on file  Tobacco Use   Smoking status: Never   Smokeless tobacco: Never  Vaping Use   Vaping status: Never Used  Substance and Sexual Activity   Alcohol use: Yes    Alcohol/week: 5.0 standard drinks of alcohol    Types: 5 Glasses of wine per week   Drug use: No   Sexual activity: Yes  Other Topics Concern   Not on file  Social History Narrative   ** Merged History Encounter **       Social Drivers of Health   Financial Resource Strain: Low Risk  (07/28/2021)   Received from [facility]   Overall Financial Resource Strain (CARDIA)    Difficulty of Paying Living Expenses: Not very hard  Food Insecurity: No Food Insecurity (07/28/2021)   Received from [facility]   Hunger Vital Sign  Worried About Programme researcher, broadcasting/film/video in the Last Year: Never true    Ran Out of Food in the Last Year: Never true  Transportation Needs: Not on file  Physical Activity: Unknown (07/28/2021)   Received from [facility]   Exercise Vital Sign    Days of Exercise per Week: 1 day    Minutes of Exercise per Session: Not on file  Stress: No Stress Concern Present (07/28/2021)   Received from [facility] - Occupational Stress Questionnaire    Feeling of Stress : Only a little  Social Connections: Unknown (08/14/2022)   Received from [facility]   Social Network    Social Network: Not on file  Intimate Partner Violence: Unknown (08/14/2022)   Received from [facility]   HITS    Physically Hurt: Not on file    Insult or Talk Down To: Not on file    Threaten Physical Harm: Not on file    Scream or Curse: Not on file     Review of Systems:  All other review of systems negative except as mentioned in the HPI.  Physical Exam: Vital signs BP (!) 137/93 (BP Location: Right Arm, Patient Position: Sitting, Cuff Size: Normal)   Pulse 85   Temp 98.1 F (36.7 C) (Temporal)   Ht 5\' 4"  (1.626 m)   Wt 148 lb (67.1 kg)   SpO2 100%   BMI 25.40 kg/m   General:   Alert,  Well-developed, well-nourished, pleasant and cooperative in NAD Lungs:  Clear throughout to auscultation.   Heart:  Regular rate and rhythm; no murmurs, clicks, rubs,  or gallops. Abdomen:  Soft, nontender and nondistended. Normal bowel sounds.   Neuro/Psych:  Alert and cooperative. Normal mood and affect. A and O x 3   @Taviana Westergren  Sena Slate, MD, [facility] [phone] (pager) 03/04/2023 2:32 PM@

## 2023-03-04 ENCOUNTER — Encounter: Payer: Self-pay | Admitting: Internal Medicine

## 2023-03-04 ENCOUNTER — Ambulatory Visit (AMBULATORY_SURGERY_CENTER): Payer: BC Managed Care – PPO | Admitting: Internal Medicine

## 2023-03-04 VITALS — BP 110/77 | HR 70 | Temp 98.1°F | Resp 11 | Ht 64.0 in | Wt 148.0 lb

## 2023-03-04 DIAGNOSIS — Z1211 Encounter for screening for malignant neoplasm of colon: Secondary | ICD-10-CM

## 2023-03-04 MED ORDER — SODIUM CHLORIDE 0.9 % IV SOLN: 500.0000 mL | Freq: Once | INTRAVENOUS | Status: DC

## 2023-03-04 NOTE — Patient Instructions (Addendum)
Your colonoscopy was normal. No polyps or cancer were seen.  Next routine colonoscopy or other screening test in 10 years - 2034.  I appreciate the opportunity to care for you. Iva Boop, MD, FACG  YOU HAD AN ENDOSCOPIC PROCEDURE TODAY AT THE [facility]:   Refer to the procedure report that was given to you for any specific questions about what was found during the examination.  If the procedure report does not answer your questions, please call your gastroenterologist to clarify.  If you requested that your care partner not be given the details of your procedure findings, then the procedure report has been included in a sealed envelope for you to review at your convenience later.  YOU SHOULD EXPECT: Some feelings of bloating in the abdomen. Passage of more gas than usual.  Walking can help get rid of the air that was put into your GI tract during the procedure and reduce the bloating. If you had a lower endoscopy (such as a colonoscopy or flexible sigmoidoscopy) you may notice spotting of blood in your stool or on the toilet paper. If you underwent a bowel prep for your procedure, you may not have a normal bowel movement for a few days.  Please Note:  You might notice some irritation and congestion in your nose or some drainage.  This is from the oxygen used during your procedure.  There is no need for concern and it should clear up in a day or so.  SYMPTOMS TO REPORT IMMEDIATELY:  Following lower endoscopy (colonoscopy or flexible sigmoidoscopy):  Excessive amounts of blood in the stool  Significant tenderness or worsening of abdominal pains  Swelling of the abdomen that is new, acute  Fever of 100F or higher  For urgent or emergent issues, a gastroenterologist can be reached at any hour by calling [phone]. Do not use MyChart messaging for urgent concerns.    DIET:  We do recommend a small meal at first, but then you may proceed to your regular diet.  Drink  plenty of fluids but you should avoid alcoholic beverages for 24 hours.  ACTIVITY:  You should plan to take it easy for the rest of today and you should NOT DRIVE or use heavy machinery until tomorrow (because of the sedation medicines used during the test).    FOLLOW UP: Our staff will call the number listed on your records the next business day following your procedure.  We will call around 7:15- 8:00 am to check on you and address any questions or concerns that you may have regarding the information given to you following your procedure. If we do not reach you, we will leave a message.     If any biopsies were taken you will be contacted by phone or by letter within the next 1-3 weeks.  Please call us at [phone] if you have not heard about the biopsies in 3 weeks.    SIGNATURES/CONFIDENTIALITY: You and/or your care partner have signed paperwork which will be entered into your electronic medical record.  These signatures attest to the fact that that the information above on your After Visit Summary has been reviewed and is understood.  Full responsibility of the confidentiality of this discharge information lies with you and/or your care-partner.

## 2023-03-04 NOTE — Progress Notes (Signed)
Vss nad trans to pacu 

## 2023-03-04 NOTE — Op Note (Signed)
[facility] Patient Name: Cassandra Estrada Procedure Date: 03/04/2023 2:29 PM MRN: [id number] Endoscopist: Iva Boop , MD, [id number] Age: 47 Referring MD:  Date of Birth: Nov 02, 1975 Gender: Female Account #: [id number] Procedure:                Colonoscopy Indications:              Screening for colorectal malignant neoplasm, This                            is the patient's first colonoscopy Medicines:                Monitored Anesthesia Care Procedure:                Pre-Anesthesia Assessment:                           - Prior to the procedure, a History and Physical                            was performed, and patient medications and                            allergies were reviewed. The patient's tolerance of                            previous anesthesia was also reviewed. The risks                            and benefits of the procedure and the sedation                            options and risks were discussed with the patient.                            All questions were answered, and informed consent                            was obtained. Prior Anticoagulants: The patient has                            taken no anticoagulant or antiplatelet agents. ASA                            Grade Assessment: II - A patient with mild systemic                            disease. After reviewing the risks and benefits,                            the patient was deemed in satisfactory condition to                            undergo the procedure.  After obtaining informed consent, the colonoscope                            was passed under direct vision. Throughout the                            procedure, the patient's blood pressure, pulse, and                            oxygen saturations were monitored continuously. The                            Olympus CF-HQ190L ([id number]) Colonoscope was                            introduced through the  anus and advanced to the the                            cecum, identified by appendiceal orifice and                            ileocecal valve. The colonoscopy was performed                            without difficulty. The patient tolerated the                            procedure well. The quality of the bowel                            preparation was good. The ileocecal valve,                            appendiceal orifice, and rectum were photographed.                            The bowel preparation used was Miralax via split                            dose instruction. Scope In: 2:40:56 PM Scope Out: 2:51:26 PM Scope Withdrawal Time: 0 hours 7 minutes 38 seconds  Total Procedure Duration: 0 hours 10 minutes 30 seconds  Findings:                 The perianal and digital rectal examinations were                            normal.                           The entire examined colon appeared normal on direct                            and retroflexion views. Complications:            No immediate complications. Estimated Blood  Loss:     Estimated blood loss: none. Impression:               - The entire examined colon is normal on direct and                            retroflexion views.                           - No specimens collected. Recommendation:           - Patient has a contact number available for                            emergencies. The signs and symptoms of potential                            delayed complications were discussed with the                            patient. Return to normal activities tomorrow.                            Written discharge instructions were provided to the                            patient.                           - Resume previous diet.                           - Continue present medications.                           - Repeat colonoscopy in 10 years for screening                            purposes. Iva Boop,  MD 03/04/2023 2:56:19 PM This report has been signed electronically.

## 2023-03-07 ENCOUNTER — Telehealth: Payer: Self-pay

## 2023-03-07 ENCOUNTER — Other Ambulatory Visit (HOSPITAL_BASED_OUTPATIENT_CLINIC_OR_DEPARTMENT_OTHER): Payer: Self-pay

## 2023-03-07 MED ORDER — LISDEXAMFETAMINE DIMESYLATE 60 MG PO CAPS
60.0000 mg | ORAL_CAPSULE | Freq: Every day | ORAL | 0 refills | Status: DC
  Filled 2023-03-07: qty 30, 30d supply, fill #0

## 2023-03-07 NOTE — Telephone Encounter (Signed)
Left message on follow up call. 

## 2023-04-06 ENCOUNTER — Other Ambulatory Visit: Payer: Self-pay

## 2023-04-06 ENCOUNTER — Other Ambulatory Visit (HOSPITAL_BASED_OUTPATIENT_CLINIC_OR_DEPARTMENT_OTHER): Payer: Self-pay

## 2023-04-06 MED ORDER — LISDEXAMFETAMINE DIMESYLATE 60 MG PO CAPS
60.0000 mg | ORAL_CAPSULE | Freq: Every day | ORAL | 0 refills | Status: DC
  Filled 2023-04-06: qty 30, 30d supply, fill #0

## 2023-05-16 ENCOUNTER — Other Ambulatory Visit (HOSPITAL_BASED_OUTPATIENT_CLINIC_OR_DEPARTMENT_OTHER): Payer: Self-pay

## 2023-05-16 MED ORDER — LISDEXAMFETAMINE DIMESYLATE 60 MG PO CAPS
60.0000 mg | ORAL_CAPSULE | Freq: Every day | ORAL | 0 refills | Status: DC
  Filled 2023-05-16: qty 30, 30d supply, fill #0

## 2023-06-13 ENCOUNTER — Other Ambulatory Visit (HOSPITAL_BASED_OUTPATIENT_CLINIC_OR_DEPARTMENT_OTHER): Payer: Self-pay

## 2023-06-13 MED ORDER — LISDEXAMFETAMINE DIMESYLATE 60 MG PO CAPS
60.0000 mg | ORAL_CAPSULE | Freq: Every day | ORAL | 0 refills | Status: DC
  Filled 2023-06-13: qty 30, 30d supply, fill #0

## 2023-06-14 ENCOUNTER — Other Ambulatory Visit (HOSPITAL_BASED_OUTPATIENT_CLINIC_OR_DEPARTMENT_OTHER): Payer: Self-pay

## 2023-06-15 ENCOUNTER — Other Ambulatory Visit (HOSPITAL_BASED_OUTPATIENT_CLINIC_OR_DEPARTMENT_OTHER): Payer: Self-pay

## 2023-11-23 ENCOUNTER — Other Ambulatory Visit (HOSPITAL_BASED_OUTPATIENT_CLINIC_OR_DEPARTMENT_OTHER): Payer: Self-pay

## 2023-11-23 MED ORDER — LISDEXAMFETAMINE DIMESYLATE 60 MG PO CAPS
60.0000 mg | ORAL_CAPSULE | Freq: Every day | ORAL | 0 refills | Status: DC
  Filled 2023-11-23: qty 30, 30d supply, fill #0
